# Patient Record
Sex: Female | Born: 1990 | Hispanic: No | Marital: Single
Health system: Northeastern US, Academic
[De-identification: ages and names within clinical notes are randomized; demographics above are authoritative.]

## PROBLEM LIST (undated history)

## (undated) DIAGNOSIS — R102 Pelvic and perineal pain unspecified side: Secondary | ICD-10-CM

## (undated) DIAGNOSIS — N739 Female pelvic inflammatory disease, unspecified: Secondary | ICD-10-CM

---

## 2012-05-08 ENCOUNTER — Encounter (HOSPITAL_COMMUNITY): Payer: Self-pay | Admitting: Emergency Medicine

## 2012-05-08 ENCOUNTER — Emergency Department (HOSPITAL_COMMUNITY): Payer: BC Managed Care – PPO

## 2012-05-08 ENCOUNTER — Emergency Department (HOSPITAL_COMMUNITY)
Admission: EM | Admit: 2012-05-08 | Discharge: 2012-05-08 | Disposition: A | Discharge status: Home / Self Care | Payer: BC Managed Care – PPO | Attending: Emergency Medicine | Admitting: Emergency Medicine

## 2012-05-08 DIAGNOSIS — M940 Chondrocostal junction syndrome [Tietze]: Secondary | ICD-10-CM

## 2012-05-08 DIAGNOSIS — B9689 Other specified bacterial agents as the cause of diseases classified elsewhere: Secondary | ICD-10-CM | POA: Insufficient documentation

## 2012-05-08 DIAGNOSIS — N760 Acute vaginitis: Secondary | ICD-10-CM

## 2012-05-08 DIAGNOSIS — N390 Urinary tract infection, site not specified: Secondary | ICD-10-CM

## 2012-05-08 DIAGNOSIS — R0602 Shortness of breath: Secondary | ICD-10-CM | POA: Insufficient documentation

## 2012-05-08 DIAGNOSIS — J029 Acute pharyngitis, unspecified: Secondary | ICD-10-CM

## 2012-05-08 DIAGNOSIS — A499 Bacterial infection, unspecified: Secondary | ICD-10-CM | POA: Insufficient documentation

## 2012-05-08 LAB — URINALYSIS, ROUTINE W REFLEX MICROSCOPIC
Glucose, UA: NEGATIVE mg/dL
Ketones, ur: 40 mg/dL — AB
Nitrite: NEGATIVE
Nitrite: NEGATIVE
Specific Gravity, Urine: 1.031 — ABNORMAL HIGH (ref 1.005–1.030)
Specific Gravity, Urine: 1.031 — ABNORMAL HIGH (ref 1.005–1.030)
Urobilinogen, UA: 1 mg/dL (ref 0.0–1.0)
pH: 5.5 (ref 5.0–8.0)
pH: 6 (ref 5.0–8.0)

## 2012-05-08 LAB — URINE MICROSCOPIC-ADD ON

## 2012-05-08 LAB — WET PREP, GENITAL
Trich, Wet Prep: NONE SEEN
Yeast Wet Prep HPF POC: NONE SEEN

## 2012-05-08 LAB — CBC WITH DIFFERENTIAL/PLATELET
Basophils Relative: 0 % (ref 0–1)
Eosinophils Absolute: 0 10*3/uL (ref 0.0–0.7)
Lymphs Abs: 1.2 10*3/uL (ref 0.7–4.0)
MCH: 29 pg (ref 26.0–34.0)
MCHC: 33.3 g/dL (ref 30.0–36.0)
Neutro Abs: 10.8 10*3/uL — ABNORMAL HIGH (ref 1.7–7.7)
Neutrophils Relative %: 85 % — ABNORMAL HIGH (ref 43–77)
Platelets: 278 10*3/uL (ref 150–400)
RBC: 4.17 MIL/uL (ref 3.87–5.11)

## 2012-05-08 LAB — POCT I-STAT TROPONIN I: Troponin i, poc: 0 ng/mL (ref 0.00–0.08)

## 2012-05-08 LAB — BASIC METABOLIC PANEL
BUN: 8 mg/dL (ref 6–23)
CO2: 24 mEq/L (ref 19–32)
Calcium: 9.8 mg/dL (ref 8.4–10.5)
Chloride: 103 mEq/L (ref 96–112)
Creatinine, Ser: 0.92 mg/dL (ref 0.50–1.10)
Glucose, Bld: 77 mg/dL (ref 70–99)

## 2012-05-08 LAB — CBC
HCT: 36.2 % (ref 36.0–46.0)
MCH: 29.6 pg (ref 26.0–34.0)
MCV: 87.2 fL (ref 78.0–100.0)
Platelets: 286 10*3/uL (ref 150–400)
RBC: 4.15 MIL/uL (ref 3.87–5.11)
WBC: 11.3 10*3/uL — ABNORMAL HIGH (ref 4.0–10.5)

## 2012-05-08 LAB — D-DIMER, QUANTITATIVE: D-Dimer, Quant: <0.27 ug/mL-FEU (ref 0.00–0.48)

## 2012-05-08 MED ORDER — AZITHROMYCIN 250 MG PO TABS
1000.0000 mg | ORAL_TABLET | Freq: Once | ORAL | Status: AC
  Administered 2012-05-08: 1000 mg via ORAL
  Filled 2012-05-08: qty 4

## 2012-05-08 MED ORDER — SULFAMETHOXAZOLE-TRIMETHOPRIM 800-160 MG PO TABS: 1.0000 | ORAL_TABLET | Freq: Two times a day (BID) | ORAL | Status: DC

## 2012-05-08 MED ORDER — CEFTRIAXONE SODIUM 250 MG IJ SOLR
250.0000 mg | Freq: Once | INTRAMUSCULAR | Status: AC
  Administered 2012-05-08: 250 mg via INTRAMUSCULAR
  Filled 2012-05-08: qty 250

## 2012-05-08 MED ORDER — FAMOTIDINE 20 MG PO TABS: 20.0000 mg | ORAL_TABLET | Freq: Two times a day (BID) | ORAL | Status: DC

## 2012-05-08 MED ORDER — HYDROCODONE-ACETAMINOPHEN 5-500 MG PO TABS: 1.0000 | ORAL_TABLET | Freq: Four times a day (QID) | ORAL | Status: DC | PRN

## 2012-05-08 MED ORDER — METRONIDAZOLE 500 MG PO TABS: 500.0000 mg | ORAL_TABLET | Freq: Two times a day (BID) | ORAL | Status: DC

## 2012-05-08 MED ORDER — LIDOCAINE HCL (PF) 1 % IJ SOLN
INTRAMUSCULAR | Status: AC
  Administered 2012-05-08: 5 mL
  Filled 2012-05-08: qty 5

## 2012-05-08 NOTE — ED Notes (Signed)
Pt comes to the ED c/o sore throat, centralized chest pain, and LLQ abdominal pain. Chest pain has been ongoing to for the past two weeks and hurts to breathe or talk  - abdominal pain has been for the past five days.  Pt reports, "It feels a little uncomfortable." when asked about pain when urinating.   Rates pain 5/10

## 2012-05-08 NOTE — ED Notes (Signed)
Pelvic cart set up at bedside  

## 2012-05-08 NOTE — ED Notes (Signed)
In to administer meds; pt currently in [place]

## 2012-05-08 NOTE — ED Notes (Addendum)
Advised pt of longest wait time for department.  Advised that if there is any change in condition or needs to let this RN know.

## 2012-05-08 NOTE — ED Notes (Signed)
Pt c/o intermittent mid sternal CP x 2 weeks worse when laying flat; pt sts sore throat x several days

## 2012-05-08 NOTE — ED Notes (Signed)
I-stat troponin 0.00 

## 2012-05-08 NOTE — ED Provider Notes (Signed)
History     CSN: [id number]  Arrival date & time 05/08/12  1239   First MD Initiated Contact with Patient 05/08/12 1507      Chief Complaint  Patient presents with  . Chest Pain  . Sore Throat  . Abdominal Pain    (Consider location/radiation/quality/duration/timing/severity/associated sxs/prior treatment) The history is provided by the patient and medical records.    Kristine Ho is a 21 y.o. female presents to the emergency department c/o 2 weeks of chest pain.  Pain was gradual onset, described as achy, increased with movement, talking and lying flat, located around the sternum, rated at a 5/10, non-radiating.  Pain waxes and wanes, but has not resolved.  Denies nausea, vomiting, diarrhea, lightheaded, dizzy, syncopal episodes.  She has associated shortness of breath.  Pt endorses heart murmur as a child, no other cardiac Hx.  Denies family Hx of MI, CAD or sudden cardiac death.    She also c/o sore throat and pelvic pain which both began about 5 days ago.  Pain in the throat was of gradual onset and is located in the R > L. Denies fever, congestion, rhinorrhea, sinus pain, sick contacts.  Pelvic pain is located in the LLQ, gradual onset, constant, described as achy.  Denies constipated, dysuria, hematuria, frequency, urgency, vaginal bleeding, vaginal discharge.  Menstrual cycle has been regular.  LMP 2 weeks ago.  Pt is sexually active with 1 partner with inconsistent use of protection.  Pt takes OCP.  Denies surgery, long trips, hemoptysis, immobilization.    History reviewed. No pertinent past medical history.  History reviewed. No pertinent past surgical history.  History reviewed. No pertinent family history.  History  Substance Use Topics  . Smoking status: Never Smoker   . Smokeless tobacco: Not on file  . Alcohol Use: No    OB History    Grav Para Term Preterm Abortions TAB SAB Ect Mult Living                  Review of Systems  Constitutional: Negative for  fever, chills, diaphoresis, appetite change, fatigue and unexpected weight change.  HENT: Positive for sore throat. Negative for congestion, rhinorrhea, mouth sores, trouble swallowing, neck pain, neck stiffness and sinus pressure.   Eyes: Negative for visual disturbance.  Respiratory: Positive for shortness of breath. Negative for cough, chest tightness and wheezing.   Cardiovascular: Positive for chest pain.  Gastrointestinal: Positive for abdominal pain. Negative for nausea, vomiting, diarrhea and constipation.  Genitourinary: Positive for pelvic pain. Negative for dysuria, urgency, frequency and hematuria.  Musculoskeletal: Negative for back pain and joint swelling.  Skin: Negative for rash.  Neurological: Negative for syncope, light-headedness and headaches.  Psychiatric/Behavioral: Negative for disturbed wake/sleep cycle. The patient is not nervous/anxious.     Allergies  Review of patient's allergies indicates no known allergies.  Home Medications  No current outpatient prescriptions on file.  BP 145/96  Pulse 63  Temp 98.7 F (37.1 C) (Oral)  Resp 18  SpO2 95%  LMP 04/24/2012  Physical Exam  Nursing note and vitals reviewed. Constitutional: She appears well-developed and well-nourished. No distress.  HENT:  Head: Normocephalic and atraumatic.  Right Ear: Tympanic membrane, external ear and ear canal normal.  Left Ear: Tympanic membrane, external ear and ear canal normal.  Nose: Nose normal. Right sinus exhibits no maxillary sinus tenderness and no frontal sinus tenderness. Left sinus exhibits no maxillary sinus tenderness and no frontal sinus tenderness.  Mouth/Throat: Uvula is midline and mucous  membranes are normal. No oropharyngeal exudate, posterior oropharyngeal edema, posterior oropharyngeal erythema or tonsillar abscesses.  Eyes: Conjunctivae normal are normal. Pupils are equal, round, and reactive to light. No scleral icterus.  Neck: Normal range of motion. Neck  supple. No JVD present. No tracheal deviation present.  Cardiovascular: Normal rate, regular rhythm, normal heart sounds and intact distal pulses.  Exam reveals no gallop and no friction rub.   No murmur heard. Pulmonary/Chest: Effort normal and breath sounds normal. No respiratory distress. She has no wheezes. She exhibits tenderness (TTP over the sternal borders).  Abdominal: Soft. Normal appearance and bowel sounds are normal. She exhibits no mass. There is tenderness in the left lower quadrant. There is no rigidity, no rebound, no guarding, no CVA tenderness, no tenderness at McBurney's point and negative Murphy's sign.  Genitourinary:       Pelvic exam:  VULVA: normal appearing vulva with no masses, tenderness or lesions,  VAGINA: normal appearing vagina with normal color, no lesions, vaginal discharge - white, creamy and malodorous,  CERVIX: normal appearing cervix without lesions, no cervical motion tenderness cervical discharge present - white, creamy and malodorous,  UTERUS: uterus is normal size, shape, consistency and nontender,  ADNEXA: normal adnexa in size, no masses, tenderness left, no tenderness on right WET MOUNT done - results: clue cells, white blood cells, DNA probe for chlamydia and GC obtained, exam chaperoned by Carollee Herter, RN   Musculoskeletal: Normal range of motion. She exhibits no edema and no tenderness.  Lymphadenopathy:    She has no cervical adenopathy.  Neurological: She is alert. She exhibits normal muscle tone. Coordination normal.       Speech is clear and goal oriented Moves extremities without ataxia  Skin: Skin is warm and dry. No rash noted. She is not diaphoretic.  Psychiatric: She has a normal mood and affect.    ED Course  Procedures (including critical care time)  Labs Reviewed  CBC - Abnormal; Notable for the following:    WBC 11.3 (*)     All other components within normal limits  URINALYSIS, ROUTINE W REFLEX MICROSCOPIC - Abnormal; Notable  for the following:    Color, Urine AMBER (*)  BIOCHEMICALS MAY BE AFFECTED BY COLOR   Specific Gravity, Urine 1.031 (*)     Hgb urine dipstick SMALL (*)     Bilirubin Urine SMALL (*)     Ketones, ur 40 (*)     Leukocytes, UA SMALL (*)     All other components within normal limits  URINALYSIS, ROUTINE W REFLEX MICROSCOPIC - Abnormal; Notable for the following:    Color, Urine AMBER (*)  BIOCHEMICALS MAY BE AFFECTED BY COLOR   APPearance CLOUDY (*)     Specific Gravity, Urine 1.031 (*)     Hgb urine dipstick SMALL (*)     Bilirubin Urine SMALL (*)     Ketones, ur 40 (*)     Leukocytes, UA LARGE (*)     All other components within normal limits  CBC WITH DIFFERENTIAL - Abnormal; Notable for the following:    WBC 12.7 (*)     Neutrophils Relative 85 (*)     Neutro Abs 10.8 (*)     Lymphocytes Relative 9 (*)     All other components within normal limits  WET PREP, GENITAL - Abnormal; Notable for the following:    Clue Cells Wet Prep HPF POC MODERATE (*)     WBC, Wet Prep HPF POC MANY (*)  All other components within normal limits  URINE MICROSCOPIC-ADD ON - Abnormal; Notable for the following:    Squamous Epithelial / LPF MANY (*)     Bacteria, UA MANY (*)     Casts HYALINE CASTS (*)     All other components within normal limits  URINE MICROSCOPIC-ADD ON - Abnormal; Notable for the following:    Squamous Epithelial / LPF FEW (*)     Bacteria, UA FEW (*)     All other components within normal limits  BASIC METABOLIC PANEL  RAPID STREP SCREEN  POCT PREGNANCY, URINE  POCT I-STAT TROPONIN I  D-DIMER, QUANTITATIVE  GC/CHLAMYDIA PROBE AMP, GENITAL  URINALYSIS, ROUTINE W REFLEX MICROSCOPIC   Dg Chest 2 View  05/08/2012  *RADIOLOGY REPORT*  Clinical Data: Chest pain, sore throat.  CHEST - 2 VIEW  Comparison: None.  Findings: Heart and mediastinal contours are within normal limits. No focal opacities or effusions.  No acute bony abnormality.  IMPRESSION: No active cardiopulmonary  disease.   Original Report Authenticated By: Cyndie Chime, M.D.     Results for orders placed during the hospital encounter of 05/08/12  CBC      Component Value Range   WBC 11.3 (*) 4.0 - 10.5 K/uL   RBC 4.15  3.87 - 5.11 MIL/uL   Hemoglobin 12.3  12.0 - 15.0 g/dL   HCT 16.1  09.6 - 04.5 %   MCV 87.2  78.0 - 100.0 fL   MCH 29.6  26.0 - 34.0 pg   MCHC 34.0  30.0 - 36.0 g/dL   RDW 40.9  81.1 - 91.4 %   Platelets 286  150 - 400 K/uL  BASIC METABOLIC PANEL      Component Value Range   Sodium 139  135 - 145 mEq/L   Potassium 4.1  3.5 - 5.1 mEq/L   Chloride 103  96 - 112 mEq/L   CO2 24  19 - 32 mEq/L   Glucose, Bld 77  70 - 99 mg/dL   BUN 8  6 - 23 mg/dL   Creatinine, Ser 7.82  0.50 - 1.10 mg/dL   Calcium 9.8  8.4 - 95.6 mg/dL   GFR calc non Af Amer >90  >90 mL/min   GFR calc Af Amer >90  >90 mL/min  RAPID STREP SCREEN      Component Value Range   Streptococcus, Group A Screen (Direct) NEGATIVE  NEGATIVE  URINALYSIS, ROUTINE W REFLEX MICROSCOPIC      Component Value Range   Color, Urine AMBER (*) YELLOW   APPearance CLEAR  CLEAR   Specific Gravity, Urine 1.031 (*) 1.005 - 1.030   pH 6.0  5.0 - 8.0   Glucose, UA NEGATIVE  NEGATIVE mg/dL   Hgb urine dipstick SMALL (*) NEGATIVE   Bilirubin Urine SMALL (*) NEGATIVE   Ketones, ur 40 (*) NEGATIVE mg/dL   Protein, ur NEGATIVE  NEGATIVE mg/dL   Urobilinogen, UA 1.0  0.0 - 1.0 mg/dL   Nitrite NEGATIVE  NEGATIVE   Leukocytes, UA SMALL (*) NEGATIVE  POCT PREGNANCY, URINE      Component Value Range   Preg Test, Ur NEGATIVE  NEGATIVE  POCT I-STAT TROPONIN I      Component Value Range   Troponin i, poc 0.00  0.00 - 0.08 ng/mL   Comment 3           URINALYSIS, ROUTINE W REFLEX MICROSCOPIC      Component Value Range   Color, Urine AMBER (*)  YELLOW   APPearance CLOUDY (*) CLEAR   Specific Gravity, Urine 1.031 (*) 1.005 - 1.030   pH 5.5  5.0 - 8.0   Glucose, UA NEGATIVE  NEGATIVE mg/dL   Hgb urine dipstick SMALL (*) NEGATIVE    Bilirubin Urine SMALL (*) NEGATIVE   Ketones, ur 40 (*) NEGATIVE mg/dL   Protein, ur NEGATIVE  NEGATIVE mg/dL   Urobilinogen, UA 1.0  0.0 - 1.0 mg/dL   Nitrite NEGATIVE  NEGATIVE   Leukocytes, UA LARGE (*) NEGATIVE  CBC WITH DIFFERENTIAL      Component Value Range   WBC 12.7 (*) 4.0 - 10.5 K/uL   RBC 4.17  3.87 - 5.11 MIL/uL   Hemoglobin 12.1  12.0 - 15.0 g/dL   HCT 82.9  56.2 - 13.0 %   MCV 87.1  78.0 - 100.0 fL   MCH 29.0  26.0 - 34.0 pg   MCHC 33.3  30.0 - 36.0 g/dL   RDW 86.5  78.4 - 69.6 %   Platelets 278  150 - 400 K/uL   Neutrophils Relative 85 (*) 43 - 77 %   Neutro Abs 10.8 (*) 1.7 - 7.7 K/uL   Lymphocytes Relative 9 (*) 12 - 46 %   Lymphs Abs 1.2  0.7 - 4.0 K/uL   Monocytes Relative 6  3 - 12 %   Monocytes Absolute 0.7  0.1 - 1.0 K/uL   Eosinophils Relative 0  0 - 5 %   Eosinophils Absolute 0.0  0.0 - 0.7 K/uL   Basophils Relative 0  0 - 1 %   Basophils Absolute 0.0  0.0 - 0.1 K/uL  D-DIMER, QUANTITATIVE      Component Value Range   D-Dimer, Quant <0.27  0.00 - 0.48 ug/mL-FEU  WET PREP, GENITAL      Component Value Range   Yeast Wet Prep HPF POC NONE SEEN  NONE SEEN   Trich, Wet Prep NONE SEEN  NONE SEEN   Clue Cells Wet Prep HPF POC MODERATE (*) NONE SEEN   WBC, Wet Prep HPF POC MANY (*) NONE SEEN  URINE MICROSCOPIC-ADD ON      Component Value Range   Squamous Epithelial / LPF MANY (*) RARE   WBC, UA 21-50  <3 WBC/hpf   RBC / HPF 3-6  <3 RBC/hpf   Bacteria, UA MANY (*) RARE   Casts HYALINE CASTS (*) NEGATIVE   Urine-Other MUCOUS PRESENT    URINE MICROSCOPIC-ADD ON      Component Value Range   Squamous Epithelial / LPF FEW (*) RARE   WBC, UA 7-10  <3 WBC/hpf   RBC / HPF 3-6  <3 RBC/hpf   Bacteria, UA FEW (*) RARE   Urine-Other MUCOUS PRESENT     Dg Chest 2 View  05/08/2012  *RADIOLOGY REPORT*  Clinical Data: Chest pain, sore throat.  CHEST - 2 VIEW  Comparison: None.  Findings: Heart and mediastinal contours are within normal limits. No focal opacities  or effusions.  No acute bony abnormality.  IMPRESSION: No active cardiopulmonary disease.   Original Report Authenticated By: Cyndie Chime, M.D.     ECG:  Date: 05/08/2012  Rate: 75  Rhythm: sinus arrhythmia  QRS Axis: normal  Intervals: normal  ST/T Wave abnormalities: normal  Conduction Disutrbances:none  Narrative Interpretation: NSR  Old EKG Reviewed: none available   1. BV (bacterial vaginosis)   2. Costochondritis   3. Bacterial vaginal infection   4. UTI (lower urinary tract infection)  MDM  Kristine Ho presents with chest pain, shortness of breath, pelvic pain.  DDX of MI, PE, costochondritis.  Wells Criteria for PE is low risk, pt not PERC negative 2/2 exogenous estrogen use.   Chest pain is not likely of cardiac or pulmonary etiology d/t presentation, VSS, no tracheal deviation, no JVD or new murmur, RRR, breath sounds equal bilaterally, EKG without acute abnormalities, negative troponin, and negative CXR.  CBC with mild leukocytosis of 12.7, with a left shift, rapid strep screen negative, BMP unremarkable, urine pregnancy negative, troponin negative, d-dimer negative.  Rapid strep negative.  Pelvic with left adnexal tenderness, no mass.  Ultrasound ordered to rule out ovarian torsion.  PID unlikely secondary to patient afebrile, no cervical motion tenderness.  Wet prep with moderate clue cells and many white blood cells. Patient treated prophylactically for GC and chlamydia. Will plan to DC home with treatment for UTI and BV.  Patient with Marlon Pel, PA-C in the CDU patient will await disposition in that while unit waiting for her ultrasound.  Case has been discussed with Dr. Ranae Palms who agrees with the above plan to discharge.          Dierdre Forth, PA-C 05/08/12 1840

## 2012-05-08 NOTE — ED Notes (Signed)
Assisted PA with pelvic exam - cultures obtained and sent to lab; pt tolerated procedure well

## 2012-05-10 ENCOUNTER — Encounter (HOSPITAL_COMMUNITY): Payer: Self-pay | Admitting: *Deleted

## 2012-05-10 ENCOUNTER — Telehealth (HOSPITAL_COMMUNITY): Payer: Self-pay | Admitting: Emergency Medicine

## 2012-05-10 ENCOUNTER — Emergency Department (HOSPITAL_COMMUNITY)
Admission: EM | Admit: 2012-05-10 | Discharge: 2012-05-10 | Disposition: A | Discharge status: Home / Self Care | Payer: BC Managed Care – PPO | Attending: Emergency Medicine | Admitting: Emergency Medicine

## 2012-05-10 DIAGNOSIS — T361X5A Adverse effect of cephalosporins and other beta-lactam antibiotics, initial encounter: Secondary | ICD-10-CM | POA: Insufficient documentation

## 2012-05-10 DIAGNOSIS — T3795XA Adverse effect of unspecified systemic anti-infective and antiparasitic, initial encounter: Secondary | ICD-10-CM | POA: Insufficient documentation

## 2012-05-10 DIAGNOSIS — T50905A Adverse effect of unspecified drugs, medicaments and biological substances, initial encounter: Secondary | ICD-10-CM

## 2012-05-10 DIAGNOSIS — T373X5A Adverse effect of other antiprotozoal drugs, initial encounter: Secondary | ICD-10-CM | POA: Insufficient documentation

## 2012-05-10 DIAGNOSIS — T50995A Adverse effect of other drugs, medicaments and biological substances, initial encounter: Secondary | ICD-10-CM | POA: Insufficient documentation

## 2012-05-10 MED ORDER — NITROFURANTOIN MACROCRYSTAL 100 MG PO CAPS: 100.0000 mg | ORAL_CAPSULE | Freq: Two times a day (BID) | ORAL | Status: DC

## 2012-05-10 MED ORDER — ONDANSETRON 4 MG PO TBDP
ORAL_TABLET | ORAL | Status: AC
  Filled 2012-05-10: qty 1

## 2012-05-10 MED ORDER — METRONIDAZOLE 0.75 % VA GEL: 1.0000 | Freq: Two times a day (BID) | VAGINAL | Status: DC

## 2012-05-10 NOTE — ED Provider Notes (Signed)
History     CSN: [id number]  Arrival date & time 05/10/12  4098   First MD Initiated Contact with Patient 05/10/12 2223      Chief Complaint  Patient presents with  . Nausea    (Consider location/radiation/quality/duration/timing/severity/associated sxs/prior treatment) HPI Comments: Seen and treated this ed several days ago for BV and UTI since has been vomiting the medications she take all the meds at the same time   The history is provided by the patient.    History reviewed. No pertinent past medical history.  History reviewed. No pertinent past surgical history.  No family history on file.  History  Substance Use Topics  . Smoking status: Never Smoker   . Smokeless tobacco: Not on file  . Alcohol Use: No    OB History    Grav Para Term Preterm Abortions TAB SAB Ect Mult Living                  Review of Systems  Constitutional: Negative for fever.  Gastrointestinal: Positive for nausea and vomiting. Negative for abdominal pain.  Genitourinary: Negative for dysuria, vaginal discharge and vaginal pain.  Skin: Negative for rash and wound.  Neurological: Negative for dizziness.    Allergies  Review of patient's allergies indicates no known allergies.  Home Medications   Current Outpatient Rx  Name Route Sig Dispense Refill  . FAMOTIDINE 20 MG PO TABS Oral Take 20 mg by mouth 2 (two) times daily.    Marland Kitchen HYDROCODONE-ACETAMINOPHEN 5-500 MG PO TABS Oral Take 1-2 tablets by mouth every 6 (six) hours as needed.    Azzie Roup ACE-ETH ESTRAD-FE 1-20 MG-MCG PO TABS Oral Take 1 tablet by mouth daily.    Marland Kitchen METRONIDAZOLE 0.75 % VA GEL Vaginal Place 1 Applicatorful vaginally 2 (two) times daily. 70 g 0  . NITROFURANTOIN MACROCRYSTAL 100 MG PO CAPS Oral Take 1 capsule (100 mg total) by mouth 2 (two) times daily. 14 capsule 0    BP 112/55  Pulse 66  Temp 98.5 F (36.9 C) (Oral)  Resp 16  SpO2 100%  LMP 04/24/2012  Physical Exam  Constitutional: She is oriented to  person, place, and time. She appears well-developed and well-nourished.  HENT:  Head: Normocephalic.  Eyes: Pupils are equal, round, and reactive to light.  Neck: Normal range of motion.  Cardiovascular: Normal rate.   Abdominal: Soft. She exhibits no distension. There is no tenderness.  Neurological: She is alert and oriented to person, place, and time.  Skin: Skin is warm. No rash noted.    ED Course  Procedures (including critical care time)  Labs Reviewed - No data to display No results found.   1. Medication adverse effect       MDM  Unclear which medication is causing nausea but will change both Septra to Keflex and Flagy to metrogel intravaginal cream         Arman Filter, NP 05/11/12 [phone]

## 2012-05-10 NOTE — ED Notes (Signed)
Pt actively vomiting in waiting room; zofran ODT given per protocol.

## 2012-05-10 NOTE — ED Notes (Signed)
+  Chlamydia. Patient treated with Rocephin and Zithromax. [facility] faxed. 

## 2012-05-10 NOTE — ED Provider Notes (Signed)
Medical screening examination/treatment/procedure(s) were performed by non-physician practitioner and as supervising physician I was immediately available for consultation/collaboration.   Evgenia Merriman, MD 05/10/12 0649 

## 2012-05-10 NOTE — ED Notes (Signed)
The pt has been nauseated since Thursday when she was seen here. She thinks the med she was given  Is causing her nausea.  She was treated for a std.  lmp 2 weeks ago

## 2012-05-11 NOTE — ED Provider Notes (Signed)
Medical screening examination/treatment/procedure(s) were performed by non-physician practitioner and as supervising physician I was immediately available for consultation/collaboration  Charles B. Sheldon, MD 05/11/12 1507 

## 2013-03-25 ENCOUNTER — Other Ambulatory Visit (HOSPITAL_COMMUNITY)
Admission: RE | Admit: 2013-03-25 | Discharge: 2013-03-25 | Disposition: A | Discharge status: Home / Self Care | Payer: BC Managed Care – PPO | Source: Ambulatory Visit | Attending: Emergency Medicine | Admitting: Emergency Medicine

## 2013-03-25 ENCOUNTER — Encounter (HOSPITAL_COMMUNITY): Payer: Self-pay | Admitting: Emergency Medicine

## 2013-03-25 ENCOUNTER — Emergency Department (HOSPITAL_COMMUNITY)
Admission: EM | Admit: 2013-03-25 | Discharge: 2013-03-25 | Disposition: A | Discharge status: Home / Self Care | Payer: BC Managed Care – PPO | Source: Home / Self Care

## 2013-03-25 DIAGNOSIS — N730 Acute parametritis and pelvic cellulitis: Secondary | ICD-10-CM

## 2013-03-25 DIAGNOSIS — Z113 Encounter for screening for infections with a predominantly sexual mode of transmission: Secondary | ICD-10-CM | POA: Insufficient documentation

## 2013-03-25 DIAGNOSIS — N760 Acute vaginitis: Secondary | ICD-10-CM | POA: Insufficient documentation

## 2013-03-25 DIAGNOSIS — N72 Inflammatory disease of cervix uteri: Secondary | ICD-10-CM

## 2013-03-25 LAB — POCT URINALYSIS DIP (DEVICE)
Ketones, ur: NEGATIVE mg/dL
Protein, ur: NEGATIVE mg/dL
Specific Gravity, Urine: >=1.03 (ref 1.005–1.030)

## 2013-03-25 LAB — POCT PREGNANCY, URINE: Preg Test, Ur: NEGATIVE

## 2013-03-25 MED ORDER — AZITHROMYCIN 250 MG PO TABS: ORAL_TABLET | ORAL | Status: DC

## 2013-03-25 MED ORDER — METRONIDAZOLE 500 MG PO TABS: 500.0000 mg | ORAL_TABLET | Freq: Two times a day (BID) | ORAL | Status: DC

## 2013-03-25 MED ORDER — CEFTRIAXONE SODIUM 250 MG IJ SOLR
INTRAMUSCULAR | Status: AC
  Filled 2013-03-25: qty 250

## 2013-03-25 MED ORDER — LIDOCAINE HCL (PF) 1 % IJ SOLN
INTRAMUSCULAR | Status: AC
  Filled 2013-03-25: qty 5

## 2013-03-25 MED ORDER — CEFTRIAXONE SODIUM 250 MG IJ SOLR
250.0000 mg | Freq: Once | INTRAMUSCULAR | Status: AC
  Administered 2013-03-25: 250 mg via INTRAMUSCULAR

## 2013-03-25 NOTE — ED Notes (Signed)
Pt c/o poss UTI onset 1 week sxs include: lower abd pain, mild back pain, urinary freq/urgency Denies: dysuria, hematuria, fevers, vag d/c Has increased her water intake... Alert w/no signs of acute distress.

## 2013-03-25 NOTE — ED Provider Notes (Signed)
CSN: [id number]     Arrival date & time 03/25/13  1312 History     First MD Initiated Contact with Patient 03/25/13 1359     Chief Complaint  Patient presents with  . Urinary Tract Infection   (Consider location/radiation/quality/duration/timing/severity/associated sxs/prior Treatment) HPI Comments: 22 year old female presents with mild central pelvic discomfort post voiding. She denies dysuria but states she has some increase in urinary frequency. Symptoms began approximately 2 days ago. She denies no vaginal discharge. No fever abdominal pain, back pain, chest pain, cough or shortness of breath. States her LMP was 1 week ago.  Patient is a 22 y.o. female presenting with urinary tract infection.  Urinary Tract Infection    History reviewed. No pertinent past medical history. History reviewed. No pertinent past surgical history. No family history on file. History  Substance Use Topics  . Smoking status: Never Smoker   . Smokeless tobacco: Not on file  . Alcohol Use: No   OB History   Grav Para Term Preterm Abortions TAB SAB Ect Mult Living                 Review of Systems  Constitutional: Negative.   Respiratory: Negative.   Cardiovascular: Negative.   Gastrointestinal: Negative.   Genitourinary: Positive for frequency and pelvic pain. Negative for dysuria, hematuria, flank pain, vaginal bleeding, vaginal discharge, difficulty urinating, vaginal pain and menstrual problem.  Skin: Negative.   Neurological: Negative for dizziness and weakness.    Allergies  Review of patient's allergies indicates no known allergies.  Home Medications   Current Outpatient Rx  Name  Route  Sig  Dispense  Refill  . norethindrone-ethinyl estradiol (JUNEL FE,GILDESS FE,LOESTRIN FE) 1-20 MG-MCG tablet   Oral   Take 1 tablet by mouth daily.         Marland Kitchen azithromycin (ZITHROMAX) 250 MG tablet      Take all 4 tabs po stat   4 each   0   . famotidine (PEPCID) 20 MG tablet   Oral  Take 20 mg by mouth 2 (two) times daily.         Marland Kitchen HYDROcodone-acetaminophen (VICODIN) 5-500 MG per tablet   Oral   Take 1-2 tablets by mouth every 6 (six) hours as needed.         . metroNIDAZOLE (FLAGYL) 500 MG tablet   Oral   Take 1 tablet (500 mg total) by mouth 2 (two) times daily. X 7 days   14 tablet   0   . metroNIDAZOLE (METROGEL VAGINAL) 0.75 % vaginal gel   Vaginal   Place 1 Applicatorful vaginally 2 (two) times daily.   70 g   0   . nitrofurantoin (MACRODANTIN) 100 MG capsule   Oral   Take 1 capsule (100 mg total) by mouth 2 (two) times daily.   14 capsule   0    BP 104/60  Pulse 68  Temp(Src) 99.9 F (37.7 C) (Oral)  Resp 18  SpO2 98%  LMP 03/18/2013 Physical Exam  Nursing note and vitals reviewed. Constitutional: She is oriented to person, place, and time. She appears well-developed and well-nourished. No distress.  Neck: Normal range of motion. Neck supple.  Cardiovascular: Normal rate, regular rhythm and normal heart sounds.   Pulmonary/Chest: Effort normal and breath sounds normal. No respiratory distress. She has no wheezes.  Abdominal: Soft. She exhibits no mass. There is no tenderness. There is no rebound and no guarding.  Genitourinary:  External pelvic palpation reveals mild tenderness  over the suprapubic area. No tenderness over the external adnexa or abdomen. Normal external female genitalia. There is a median thick gray discharge in the introitus. This discharged increases in volume coating the vaginal walls in the vaginal vault. The cervix is midline and is also treated with a thick gray discharge. The os is not parous but the os and ectocervix is erythematous. Bimanual reveals positive CMT and bilateral adnexal tenderness.   Musculoskeletal: She exhibits no edema and no tenderness.  Neurological: She is alert and oriented to person, place, and time. She exhibits normal muscle tone.  Skin: Skin is warm and dry.  Psychiatric: She has a  normal mood and affect.    ED Course   Procedures (including critical care time)  Labs Reviewed  POCT URINALYSIS DIP (DEVICE) - Abnormal; Notable for the following:    Hgb urine dipstick TRACE (*)    All other components within normal limits  POCT PREGNANCY, URINE  CERVICOVAGINAL ANCILLARY ONLY   Results for orders placed during the hospital encounter of 03/25/13  POCT URINALYSIS DIP (DEVICE)      Result Value Range   Glucose, UA NEGATIVE  NEGATIVE mg/dL   Bilirubin Urine NEGATIVE  NEGATIVE   Ketones, ur NEGATIVE  NEGATIVE mg/dL   Specific Gravity, Urine >=1.030  1.005 - 1.030   Hgb urine dipstick TRACE (*) NEGATIVE   pH 6.0  5.0 - 8.0   Protein, ur NEGATIVE  NEGATIVE mg/dL   Urobilinogen, UA 0.2  0.0 - 1.0 mg/dL   Nitrite NEGATIVE  NEGATIVE   Leukocytes, UA NEGATIVE  NEGATIVE  POCT PREGNANCY, URINE      Result Value Range   Preg Test, Ur NEGATIVE  NEGATIVE    No results found. 1. PID (acute pelvic inflammatory disease)   2. Vaginitis   3. Cervicitis     MDM  Rocephin 250 Milligrams IM now Rx: Azithromycin 250 mg 4 tablets by mouth stat Rx: Flagyl 500 mg twice a day for 7 days Instructions for vaginitis, PID. No alcohol for 10 days Swabs obtained for ancillary testing.   Hayden Rasmussen, NP 03/25/13 [id number]

## 2013-03-25 NOTE — ED Provider Notes (Signed)
Medical screening examination/treatment/procedure(s) were performed by non-physician practitioner and as supervising physician I was immediately available for consultation/collaboration.  Raynald Blend, MD 03/25/13 1510

## 2013-03-28 ENCOUNTER — Telehealth (HOSPITAL_COMMUNITY): Payer: Self-pay | Admitting: *Deleted

## 2013-03-28 NOTE — ED Notes (Signed)
GC/Chlamydia neg., Affirm: all neg.  Message from Kristine Rasmussen NP to call pt. To not fill Rx. Of Zithromax, since all she had was bacterial vaginosis.  I called pt. Pt. verified x 2 and given results.  She said she has already taken the Zithromax.  She said she still has pelvic pain but it is not as bad as it was.  I told her to come back if not better after the Flagy or getting worse.  Pt. voiced understanding. Kristine Ho 03/28/2013

## 2013-04-23 ENCOUNTER — Encounter (HOSPITAL_COMMUNITY): Payer: Self-pay | Admitting: Emergency Medicine

## 2013-04-23 ENCOUNTER — Inpatient Hospital Stay (HOSPITAL_COMMUNITY)
Admission: AD | Admit: 2013-04-23 | Discharge: 2013-04-23 | Disposition: A | Discharge status: Home / Self Care | Payer: BC Managed Care – PPO | Source: Ambulatory Visit | Attending: Obstetrics & Gynecology | Admitting: Obstetrics & Gynecology

## 2013-04-23 ENCOUNTER — Emergency Department (HOSPITAL_COMMUNITY)
Admission: EM | Admit: 2013-04-23 | Discharge: 2013-04-23 | Disposition: A | Discharge status: Home / Self Care | Payer: BC Managed Care – PPO | Source: Home / Self Care | Attending: Family Medicine | Admitting: Family Medicine

## 2013-04-23 ENCOUNTER — Other Ambulatory Visit (HOSPITAL_COMMUNITY)
Admission: RE | Admit: 2013-04-23 | Discharge: 2013-04-23 | Disposition: A | Discharge status: Home / Self Care | Payer: BC Managed Care – PPO | Source: Ambulatory Visit | Attending: Family Medicine | Admitting: Family Medicine

## 2013-04-23 ENCOUNTER — Inpatient Hospital Stay (HOSPITAL_COMMUNITY): Payer: BC Managed Care – PPO

## 2013-04-23 ENCOUNTER — Encounter (HOSPITAL_COMMUNITY): Payer: Self-pay | Admitting: *Deleted

## 2013-04-23 DIAGNOSIS — R1032 Left lower quadrant pain: Secondary | ICD-10-CM | POA: Insufficient documentation

## 2013-04-23 DIAGNOSIS — R102 Pelvic and perineal pain: Secondary | ICD-10-CM

## 2013-04-23 DIAGNOSIS — N760 Acute vaginitis: Secondary | ICD-10-CM | POA: Insufficient documentation

## 2013-04-23 DIAGNOSIS — N72 Inflammatory disease of cervix uteri: Secondary | ICD-10-CM | POA: Insufficient documentation

## 2013-04-23 DIAGNOSIS — Z113 Encounter for screening for infections with a predominantly sexual mode of transmission: Secondary | ICD-10-CM | POA: Insufficient documentation

## 2013-04-23 DIAGNOSIS — N949 Unspecified condition associated with female genital organs and menstrual cycle: Secondary | ICD-10-CM

## 2013-04-23 LAB — POCT URINALYSIS DIP (DEVICE)
Glucose, UA: NEGATIVE mg/dL
Ketones, ur: NEGATIVE mg/dL
Protein, ur: NEGATIVE mg/dL
Specific Gravity, Urine: >=1.03 (ref 1.005–1.030)

## 2013-04-23 LAB — POCT PREGNANCY, URINE: Preg Test, Ur: NEGATIVE

## 2013-04-23 LAB — WET PREP, GENITAL
Clue Cells Wet Prep HPF POC: NONE SEEN
Yeast Wet Prep HPF POC: NONE SEEN

## 2013-04-23 MED ORDER — AZITHROMYCIN 250 MG PO TABS
1000.0000 mg | ORAL_TABLET | Freq: Once | ORAL | Status: AC
  Administered 2013-04-23: 1000 mg via ORAL
  Filled 2013-04-23: qty 4

## 2013-04-23 MED ORDER — KETOROLAC TROMETHAMINE 60 MG/2ML IM SOLN
60.0000 mg | Freq: Once | INTRAMUSCULAR | Status: AC
  Administered 2013-04-23: 60 mg via INTRAMUSCULAR
  Filled 2013-04-23: qty 2

## 2013-04-23 MED ORDER — CEFTRIAXONE SODIUM 250 MG IJ SOLR
250.0000 mg | Freq: Once | INTRAMUSCULAR | Status: AC
  Administered 2013-04-23: 250 mg via INTRAMUSCULAR
  Filled 2013-04-23: qty 250

## 2013-04-23 NOTE — MAU Note (Signed)
Patient states she went to Urgent Care today for abdominal pain. States she was sent to MAU for further evaluation. Denies bleeding or discharge at this time. States the same thing happened about one month ago after intercourse and was treated with antibiotics.

## 2013-04-23 NOTE — ED Notes (Signed)
Pt c/o pelvic pain with discharge x 5 days. Pt denies dysuria and urgency. Pt states no meds taken for sxs. Jan Ranson, SMA

## 2013-04-23 NOTE — ED Provider Notes (Addendum)
CSN: [id number]     Arrival date & time 04/23/13  1407 History   First MD Initiated Contact with Patient 04/23/13 1425     Chief Complaint  Patient presents with  . Abdominal Pain   (Consider location/radiation/quality/duration/timing/severity/associated sxs/prior Treatment) Patient is a 22 y.o. female presenting with abdominal pain. The history is provided by the patient.  Abdominal Pain This is a new problem. The current episode started more than 2 days ago (started bcp on sun , had intercourse on sun, pain began on sun , continues since.). The problem has not changed since onset.Associated symptoms include abdominal pain.    History reviewed. No pertinent past medical history. History reviewed. No pertinent past surgical history. No family history on file. History  Substance Use Topics  . Smoking status: Never Smoker   . Smokeless tobacco: Never Used  . Alcohol Use: No   OB History   Grav Para Term Preterm Abortions TAB SAB Ect Mult Living                 Review of Systems  Constitutional: Negative.   Gastrointestinal: Positive for abdominal pain.  Genitourinary: Positive for pelvic pain. Negative for dysuria, vaginal bleeding, vaginal discharge and menstrual problem.    Allergies  Review of patient's allergies indicates no known allergies.  Home Medications   Current Outpatient Rx  Name  Route  Sig  Dispense  Refill  . azithromycin (ZITHROMAX) 250 MG tablet      Take all 4 tabs po stat   4 each   0   . famotidine (PEPCID) 20 MG tablet   Oral   Take 20 mg by mouth 2 (two) times daily.         Marland Kitchen HYDROcodone-acetaminophen (VICODIN) 5-500 MG per tablet   Oral   Take 1-2 tablets by mouth every 6 (six) hours as needed.         . metroNIDAZOLE (FLAGYL) 500 MG tablet   Oral   Take 1 tablet (500 mg total) by mouth 2 (two) times daily. X 7 days   14 tablet   0   . metroNIDAZOLE (METROGEL VAGINAL) 0.75 % vaginal gel   Vaginal   Place 1 Applicatorful  vaginally 2 (two) times daily.   70 g   0   . nitrofurantoin (MACRODANTIN) 100 MG capsule   Oral   Take 1 capsule (100 mg total) by mouth 2 (two) times daily.   14 capsule   0   . norethindrone-ethinyl estradiol (JUNEL FE,GILDESS FE,LOESTRIN FE) 1-20 MG-MCG tablet   Oral   Take 1 tablet by mouth daily.          BP 101/64  Pulse 117  Temp(Src) 98.4 F (36.9 C) (Oral)  Resp 16  SpO2 99%  LMP 04/18/2013 Physical Exam  Nursing note and vitals reviewed. Constitutional: She is oriented to person, place, and time. She appears well-developed and well-nourished.  Abdominal: Soft. Bowel sounds are normal. She exhibits no distension and no mass. There is tenderness. There is no rebound and no guarding.  Genitourinary: Vagina normal and uterus normal. Pelvic exam was performed with patient supine. Uterus is not enlarged and not tender. Cervix exhibits motion tenderness. Cervix exhibits no discharge. Right adnexum displays no mass, no tenderness and no fullness. Left adnexum displays tenderness. Left adnexum displays no mass and no fullness. No erythema, tenderness or bleeding around the vagina. No vaginal discharge found.  Neurological: She is alert and oriented to person, place, and time.  Skin:  Skin is warm and dry.    ED Course  Procedures (including critical care time) Labs Review Labs Reviewed  POCT URINALYSIS DIP (DEVICE) - Abnormal; Notable for the following:    Hgb urine dipstick TRACE (*)    Leukocytes, UA TRACE (*)    All other components within normal limits  POCT PREGNANCY, URINE  CERVICOVAGINAL ANCILLARY ONLY   Imaging Review No results found.  MDM  U/a and upreg neg    Linna Hoff, MD 04/23/13 1514  Linna Hoff, MD 04/23/13 [id number]

## 2013-04-23 NOTE — MAU Note (Signed)
C/o at least 2 episodes of pain with intercourse and pain following intercourse; first episode was about 1 month ago and the pt was given antibiotics;

## 2013-04-23 NOTE — MAU Provider Note (Signed)
History     CSN: [id number]  Arrival date and time: 04/23/13 1711   None     Chief Complaint  Patient presents with  . Abdominal Pain   HPI Pt is not pregnant and presents with lower abdominal mostly LLQ.  The pain is present all the time.  Onset of pain was 9/06.  The pain  Has dimished since that time.  The pain started with intercourse and after.  Pt is on OCs Junel FE from doctor in [place].  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 Pt has hx of chlamydia 04/2012.  Pt denies nausea or vomitnig, fever or chills, denies UTI sx- pt has had regular bowel movments. Pt's LMP was 9/1/1-24.  Pt denies vaginal discharge.  Pt was seen at Urgent care earlier today and they thought that it might be a ovarian cyst. Pt was off OCs last month and just restarted new pack. Pt is has not taken anything for pain.  Pt's pain level is 5/10 now- it has been 8-9/10.  Pt wants something for pain now.  RN note: Registered Nurse Signed MAU Note Service date: 04/23/2013 5:20 PM   Patient states she went to Urgent Care today for abdominal pain. States she was sent to MAU for further evaluation. Denies bleeding or discharge at this time. States the same thing happened about one month ago after intercourse and was treated with antibiotics.        History reviewed. No pertinent past medical history.  History reviewed. No pertinent past surgical history.  Family History  Problem Relation Age of Onset  . Diabetes Mother   . Diabetes Maternal Aunt     History  Substance Use Topics  . Smoking status: Never Smoker   . Smokeless tobacco: Never Used  . Alcohol Use: No    Allergies: No Known Allergies  Prescriptions prior to admission  Medication Sig Dispense Refill  . azithromycin (ZITHROMAX) 250 MG tablet Take all 4 tabs po stat  4 each  0  . famotidine (PEPCID) 20 MG tablet Take 20 mg by mouth 2 (two) times daily.      Marland Kitchen HYDROcodone-acetaminophen (VICODIN) 5-500 MG per tablet Take  1-2 tablets by mouth every 6 (six) hours as needed.      . metroNIDAZOLE (FLAGYL) 500 MG tablet Take 1 tablet (500 mg total) by mouth 2 (two) times daily. X 7 days  14 tablet  0  . metroNIDAZOLE (METROGEL VAGINAL) 0.75 % vaginal gel Place 1 Applicatorful vaginally 2 (two) times daily.  70 g  0  . nitrofurantoin (MACRODANTIN) 100 MG capsule Take 1 capsule (100 mg total) by mouth 2 (two) times daily.  14 capsule  0  . norethindrone-ethinyl estradiol (JUNEL FE,GILDESS FE,LOESTRIN FE) 1-20 MG-MCG tablet Take 1 tablet by mouth daily.        Review of Systems  Constitutional: Negative for fever and chills.  Gastrointestinal: Positive for abdominal pain. Negative for nausea, vomiting, diarrhea and constipation.  Genitourinary: Negative for dysuria and urgency.   Physical Exam   Blood pressure 107/56, pulse 68, temperature 98.3 F (36.8 C), resp. rate 18, height 5\' 2"  (1.575 m), weight 72.757 kg (160 lb 6.4 oz), last menstrual period 04/13/2013, SpO2 100.00%.  Physical Exam  Nursing note and vitals reviewed. Constitutional: She is oriented to person, place, and time. She appears well-developed and well-nourished. No distress.  HENT:  Head: Normocephalic.  Eyes: Pupils are equal, round, and reactive to light.  Neck: Normal range  of motion. Neck supple.  Cardiovascular: Normal rate.   Respiratory: Effort normal.  GI: Soft. She exhibits no distension. There is tenderness. There is no rebound.  Musculoskeletal: Normal range of motion.  Neurological: She is alert and oriented to person, place, and time.  Skin: Skin is warm and dry.  Psychiatric: She has a normal mood and affect.    MAU Course  Procedures Results for orders placed during the hospital encounter of 04/23/13 (from the past 24 hour(s))  POCT URINALYSIS DIP (DEVICE)     Status: Abnormal   Collection Time    04/23/13  3:28 PM      Result Value Range   Glucose, UA NEGATIVE  NEGATIVE mg/dL   Bilirubin Urine NEGATIVE  NEGATIVE    Ketones, ur NEGATIVE  NEGATIVE mg/dL   Specific Gravity, Urine >=1.030  1.005 - 1.030   Hgb urine dipstick TRACE (*) NEGATIVE   pH 5.5  5.0 - 8.0   Protein, ur NEGATIVE  NEGATIVE mg/dL   Urobilinogen, UA 0.2  0.0 - 1.0 mg/dL   Nitrite NEGATIVE  NEGATIVE   Leukocytes, UA TRACE (*) NEGATIVE  POCT PREGNANCY, URINE     Status: None   Collection Time    04/23/13  3:30 PM      Result Value Range   Preg Test, Ur NEGATIVE  NEGATIVE   Results for orders placed during the hospital encounter of 04/23/13 (from the past 24 hour(s))  WET PREP, GENITAL     Status: Abnormal   Collection Time    04/23/13  5:56 PM      Result Value Range   Yeast Wet Prep HPF POC NONE SEEN  NONE SEEN   Trich, Wet Prep NONE SEEN  NONE SEEN   Clue Cells Wet Prep HPF POC NONE SEEN  NONE SEEN   WBC, Wet Prep HPF POC MANY (*) NONE SEEN   Toradol 60mg  IM given Rocephin 250mg  IM and Zithromax 1 gm for cervicitis Care turned over to Thressa Sheller, CNM  US Transvaginal Non-ob  04/23/2013   , *RADIOLOGY REPORT*  Clinical Data: Pelvic pain  TRANSABDOMINAL AND TRANSVAGINAL ULTRASOUND OF PELVIS  Technique:  Both transabdominal and transvaginal ultrasound examinations of the pelvis were performed.  Transabdominal technique was performed for global imaging of the pelvis including uterus, ovaries, adnexal regions, and pelvic cul-de-sac.  It was necessary to proceed with endovaginal exam following the transabdominal exam to visualize the endometrium and uterus.  Comparison:  05/08/2012  Findings: Uterus:  Normal in size and appearance.  Measures 6.3 x 3.0 x 4.2 cm.  Endometrium: Normal in thickness and appearance.  Measures 5.4 mm.  Right ovary: Normal appearance/no adnexal mass.  Measures 2.2 x 1.5 x 1.4 cm.  Left ovary: Normal appearance/no adnexal mass.  Measures 2.3 x 1.3 x 1.9 cm.  Other Findings:  No free fluid  IMPRESSION: Normal study.  No evidence of pelvic mass or other significant abnormality.   Original Report Authenticated By:  Signa Kell, M.D.   US Pelvis Complete  04/23/2013   , *RADIOLOGY REPORT*  Clinical Data: Pelvic pain  TRANSABDOMINAL AND TRANSVAGINAL ULTRASOUND OF PELVIS  Technique:  Both transabdominal and transvaginal ultrasound examinations of the pelvis were performed.  Transabdominal technique was performed for global imaging of the pelvis including uterus, ovaries, adnexal regions, and pelvic cul-de-sac.  It was necessary to proceed with endovaginal exam following the transabdominal exam to visualize the endometrium and uterus.  Comparison:  05/08/2012  Findings: Uterus:  Normal in size  and appearance.  Measures 6.3 x 3.0 x 4.2 cm.  Endometrium: Normal in thickness and appearance.  Measures 5.4 mm.  Right ovary: Normal appearance/no adnexal mass.  Measures 2.2 x 1.5 x 1.4 cm.  Left ovary: Normal appearance/no adnexal mass.  Measures 2.3 x 1.3 x 1.9 cm.  Other Findings:  No free fluid  IMPRESSION: Normal study.  No evidence of pelvic mass or other significant abnormality.   Original Report Authenticated By: Signa Kell, M.D.     Assessment and Plan   1. Pelvic pain    Treated with 250mg  rocephin and 1 gram Azithromycin here in MAU GC/CT pending FU as needed  LINEBERRY,SUSAN 04/23/2013, 5:40 PM

## 2013-04-23 NOTE — MAU Note (Signed)
C/o pelvic pain for past 5 days;

## 2013-04-24 LAB — GC/CHLAMYDIA PROBE AMP: CT Probe RNA: NEGATIVE

## 2013-05-04 NOTE — MAU Provider Note (Signed)
Attestation of Attending Supervision of Advanced Practitioner (CNM/NP): Evaluation and management procedures were performed by the Advanced Practitioner under my supervision and collaboration. I have reviewed the Advanced Practitioner's note and chart, and I agree with the management and plan.  Ameerah Huffstetler H. 2:01 PM

## 2013-09-21 ENCOUNTER — Emergency Department (HOSPITAL_COMMUNITY)
Admission: EM | Admit: 2013-09-21 | Discharge: 2013-09-21 | Disposition: A | Discharge status: Home / Self Care | Payer: BC Managed Care – PPO | Source: Home / Self Care | Attending: Family Medicine | Admitting: Family Medicine

## 2013-09-21 ENCOUNTER — Other Ambulatory Visit (HOSPITAL_COMMUNITY)
Admission: RE | Admit: 2013-09-21 | Discharge: 2013-09-21 | Disposition: A | Discharge status: Home / Self Care | Payer: BC Managed Care – PPO | Source: Ambulatory Visit | Attending: Family Medicine | Admitting: Family Medicine

## 2013-09-21 ENCOUNTER — Encounter (HOSPITAL_COMMUNITY): Payer: Self-pay | Admitting: Emergency Medicine

## 2013-09-21 DIAGNOSIS — B3731 Acute candidiasis of vulva and vagina: Secondary | ICD-10-CM

## 2013-09-21 DIAGNOSIS — N760 Acute vaginitis: Secondary | ICD-10-CM | POA: Insufficient documentation

## 2013-09-21 DIAGNOSIS — B373 Candidiasis of vulva and vagina: Secondary | ICD-10-CM

## 2013-09-21 DIAGNOSIS — Z113 Encounter for screening for infections with a predominantly sexual mode of transmission: Secondary | ICD-10-CM | POA: Insufficient documentation

## 2013-09-21 LAB — POCT URINALYSIS DIP (DEVICE)
BILIRUBIN URINE: NEGATIVE
GLUCOSE, UA: NEGATIVE mg/dL
HGB URINE DIPSTICK: NEGATIVE
KETONES UR: NEGATIVE mg/dL
Nitrite: NEGATIVE
Protein, ur: NEGATIVE mg/dL
UROBILINOGEN UA: 1 mg/dL (ref 0.0–1.0)
pH: 6 (ref 5.0–8.0)

## 2013-09-21 LAB — POCT PREGNANCY, URINE: PREG TEST UR: NEGATIVE

## 2013-09-21 MED ORDER — FLUCONAZOLE 150 MG PO TABS: 150.0000 mg | ORAL_TABLET | Freq: Once | ORAL | Status: DC

## 2013-09-21 NOTE — ED Provider Notes (Signed)
Kristine FlemingsJanea Ho is a 23 y.o. female who presents to Urgent Care today for vulvar and vaginal pain. This is been present for one month and is associated with mild itching and discharge. No medications tried. No fevers or chills nausea vomiting or diarrhea.   History reviewed. No pertinent past medical history. History  Substance Use Topics  . Smoking status: Never Smoker   . Smokeless tobacco: Never Used  . Alcohol Use: No   ROS as above Medications: No current facility-administered medications for this encounter.   Current Outpatient Prescriptions  Medication Sig Dispense Refill  . fluconazole (DIFLUCAN) 150 MG tablet Take 1 tablet (150 mg total) by mouth once.  1 tablet  1  . norethindrone-ethinyl estradiol (JUNEL FE,GILDESS FE,LOESTRIN FE) 1-20 MG-MCG tablet Take 1 tablet by mouth daily.        Exam:  BP 103/68  Pulse 64  Temp(Src) 98.5 F (36.9 C) (Oral)  Resp 16  SpO2 98% Gen: Well NAD GYN: Inflamed external genitalia with mild white discharge present. No vesicular lesions noted. Vaginal canal with clumpy white discharge. Normal-appearing cervix. No cervical motion tenderness.  Results for orders placed during the hospital encounter of 09/21/13 (from the past 24 hour(s))  POCT URINALYSIS DIP (DEVICE)     Status: Abnormal   Collection Time    09/21/13  1:26 PM      Result Value Range   Glucose, UA NEGATIVE  NEGATIVE mg/dL   Bilirubin Urine NEGATIVE  NEGATIVE   Ketones, ur NEGATIVE  NEGATIVE mg/dL   Specific Gravity, Urine >=1.030  1.005 - 1.030   Hgb urine dipstick NEGATIVE  NEGATIVE   pH 6.0  5.0 - 8.0   Protein, ur NEGATIVE  NEGATIVE mg/dL   Urobilinogen, UA 1.0  0.0 - 1.0 mg/dL   Nitrite NEGATIVE  NEGATIVE   Leukocytes, UA TRACE (*) NEGATIVE  POCT PREGNANCY, URINE     Status: None   Collection Time    09/21/13  1:31 PM      Result Value Range   Preg Test, Ur NEGATIVE  NEGATIVE   No results found.  Assessment and Plan: 23 y.o. female with likely candidal  vaginitis. Plan to treat fluconazole. Cytology for gonorrhea, Chlamydia, trichomonas, BV and yeast are pending.  Discussed warning signs or symptoms. Please see discharge instructions. Patient expresses understanding.    Rodolph BongEvan S Renn Stille, MD 09/21/13 1352

## 2013-09-21 NOTE — ED Notes (Signed)
PT  HA S  SYMPTOMS  OF  VAGINAL IRRITATION  WITH  DISCOMFORT  AS  WELL  AS  A  DISCHARGE     FOR  ABOUT 1  MONTH          SHE   REPORTS   SHE  WAS  TOLD  SHE HAD  HERPES      BY A  DR  ABOUT 1  MONTH  AGO  AND  SHE TOOK MEDS  FOR  THAT

## 2013-09-21 NOTE — Discharge Instructions (Signed)
Take fluconazole once. You can repeat in one week as needed I will call you with test results if abnormal  Candidal Vulvovaginitis Candidal vulvovaginitis is an infection of the vagina and vulva. The vulva is the skin around the opening of the vagina. This may cause itching and discomfort in and around the vagina.  HOME CARE  Only take medicine as told by your doctor.  Do not have sex (intercourse) until the infection is healed or as told by your doctor.  Practice safe sex.  Tell your sex partner about your infection.  Do not douche or use tampons.  Wear cotton underwear. Do not wear tight pants or panty hose.  Eat yogurt. This may help treat and prevent yeast infections. GET HELP RIGHT AWAY IF:   You have a fever.  Your problems get worse during treatment or do not get better in 3 days.  You have discomfort, irritation, or itching in your vagina or vulva area.  You have pain after sex.  You start to get belly (abdominal) pain. MAKE SURE YOU:  Understand these instructions.  Will watch your condition.  Will get help right away if you are not doing well or get worse. Document Released: 10/26/2008 Document Revised: 10/22/2011 Document Reviewed: 10/26/2008 [facility] Patient Information 2014 [employer], [place].

## 2013-09-24 NOTE — ED Notes (Signed)
GC/Chlamydia neg., Affirm: Candida pos., Gardnerella and Trich neg.  Pt. adequately treated with Diflucan. Vassie MoselleYork, Malaka Ruffner M 09/24/2013

## 2014-02-16 ENCOUNTER — Emergency Department (HOSPITAL_COMMUNITY)
Admission: EM | Admit: 2014-02-16 | Discharge: 2014-02-16 | Disposition: A | Discharge status: Home / Self Care | Payer: BC Managed Care – PPO | Source: Home / Self Care | Attending: Family Medicine | Admitting: Family Medicine

## 2014-02-16 ENCOUNTER — Encounter (HOSPITAL_COMMUNITY): Payer: Self-pay | Admitting: Emergency Medicine

## 2014-02-16 DIAGNOSIS — R21 Rash and other nonspecific skin eruption: Secondary | ICD-10-CM

## 2014-02-16 MED ORDER — CLOTRIMAZOLE-BETAMETHASONE 1-0.05 % EX CREA: TOPICAL_CREAM | CUTANEOUS | Status: DC

## 2014-02-16 NOTE — ED Notes (Signed)
Pt c/o rash on bilateral palms of hand and arms x1 month Does not itch or cause any pain Alert w/no signs of acute distress.

## 2014-02-16 NOTE — ED Provider Notes (Signed)
CSN: [id number]     Arrival date & time 02/16/14  1605 History   First MD Initiated Contact with Patient 02/16/14 1644     Chief Complaint  Patient presents with  . Rash   (Consider location/radiation/quality/duration/timing/severity/associated sxs/prior Treatment) HPI Comments: 23 year old female presents complaining of a rash on her bilateral palms of her hands as well as her arms, and one small spot on her left leg, for one month. This first began as a small spot on her left palm and has since spread to the other areas. It seemed to be getting worse until about 2 weeks ago and now seems to be eating better, I be very slowly. The rash was itchy but is now just dry and cosmetically appealing. She denies any systemic symptoms associated with this. She denies any contact with a similar rash. She denies any known exposure to syphilis, although she is sexually active. She has not attempted any treatment for this at home.  Patient is a 23 y.o. female presenting with rash.  Rash   History reviewed. No pertinent past medical history. History reviewed. No pertinent past surgical history. Family History  Problem Relation Age of Onset  . Diabetes Mother   . Diabetes Maternal Aunt    History  Substance Use Topics  . Smoking status: Never Smoker   . Smokeless tobacco: Never Used  . Alcohol Use: No   OB History   Grav Para Term Preterm Abortions TAB SAB Ect Mult Living   0              Review of Systems  Skin: Positive for rash.  All other systems reviewed and are negative.   Allergies  Review of patient's allergies indicates no known allergies.  Home Medications   Prior to Admission medications   Medication Sig Start Date End Date Taking? Authorizing Provider  clotrimazole-betamethasone (LOTRISONE) cream Apply to affected area 2 times daily prn 02/16/14   Graylon GoodZachary H Guhan Bruington, PA-C  fluconazole (DIFLUCAN) 150 MG tablet Take 1 tablet (150 mg total) by mouth once. 09/21/13   Rodolph BongEvan S Corey, MD   norethindrone-ethinyl estradiol (JUNEL FE,GILDESS FE,LOESTRIN FE) 1-20 MG-MCG tablet Take 1 tablet by mouth daily.    Historical Provider, MD   BP 107/62  Pulse 62  Temp(Src) 98.6 F (37 C) (Oral)  Resp 14  SpO2 100%  LMP 01/26/2014 Physical Exam  Nursing note and vitals reviewed. Constitutional: She is oriented to person, place, and time. Vital signs are normal. She appears well-developed and well-nourished. No distress.  HENT:  Head: Normocephalic and atraumatic.  Pulmonary/Chest: Effort normal. No respiratory distress.  Neurological: She is alert and oriented to person, place, and time. She has normal strength. Coordination normal.  Skin: Skin is warm and dry. Rash (erythematous, scaling rash in individual 1-2 cm patches, excoriated, not circular , nontender) noted. She is not diaphoretic.  Psychiatric: She has a normal mood and affect. Judgment normal.    ED Course  Procedures (including critical care time) Labs Review Labs Reviewed  RPR    Imaging Review No results found.   MDM   1. Rash    RPR sent to rule out syphilis as the cause of his palm or rash. We'll treat with Lotrisone for the time being for possible contact dermatitis or rash similar to tinea corporis. Followup if no improvement in a few days.  Meds ordered this encounter  Medications  . clotrimazole-betamethasone (LOTRISONE) cream    Sig: Apply to affected area 2 times daily  prn    Dispense:  90 g    Refill:  0    Order Specific Question:  Supervising Provider    Answer:  Bradd CanaryKINDL, JAMES D [[id number]]       Graylon GoodZachary H Minh Roanhorse, PA-C 02/16/14 1705

## 2014-02-16 NOTE — Discharge Instructions (Signed)

## 2014-02-17 LAB — RPR

## 2014-02-17 NOTE — ED Provider Notes (Signed)
Medical screening examination/treatment/procedure(s) were performed by resident physician or non-physician practitioner and as supervising physician I was immediately available for consultation/collaboration.   Javian Nudd DOUGLAS MD.   Dorena Dorfman D Cortlan Dolin, MD 02/17/14 2057 

## 2014-03-01 ENCOUNTER — Inpatient Hospital Stay (HOSPITAL_COMMUNITY)
Admission: AD | Admit: 2014-03-01 | Discharge: 2014-03-01 | Disposition: A | Discharge status: Home / Self Care | Payer: BC Managed Care – PPO | Source: Ambulatory Visit | Attending: Obstetrics & Gynecology | Admitting: Obstetrics & Gynecology

## 2014-03-01 ENCOUNTER — Encounter (HOSPITAL_COMMUNITY): Payer: Self-pay | Admitting: *Deleted

## 2014-03-01 DIAGNOSIS — N949 Unspecified condition associated with female genital organs and menstrual cycle: Secondary | ICD-10-CM | POA: Insufficient documentation

## 2014-03-01 DIAGNOSIS — A609 Anogenital herpesviral infection, unspecified: Secondary | ICD-10-CM

## 2014-03-01 DIAGNOSIS — B373 Candidiasis of vulva and vagina: Secondary | ICD-10-CM

## 2014-03-01 DIAGNOSIS — B3731 Acute candidiasis of vulva and vagina: Secondary | ICD-10-CM | POA: Insufficient documentation

## 2014-03-01 DIAGNOSIS — B009 Herpesviral infection, unspecified: Secondary | ICD-10-CM

## 2014-03-01 DIAGNOSIS — A6000 Herpesviral infection of urogenital system, unspecified: Secondary | ICD-10-CM | POA: Insufficient documentation

## 2014-03-01 HISTORY — DX: Pelvic and perineal pain: R10.2

## 2014-03-01 HISTORY — DX: Pelvic and perineal pain unspecified side: R10.20

## 2014-03-01 HISTORY — DX: Female pelvic inflammatory disease, unspecified: N73.9

## 2014-03-01 LAB — WET PREP, GENITAL
CLUE CELLS WET PREP: NONE SEEN
TRICH WET PREP: NONE SEEN

## 2014-03-01 LAB — URINALYSIS, ROUTINE W REFLEX MICROSCOPIC
BILIRUBIN URINE: NEGATIVE
Glucose, UA: NEGATIVE mg/dL
Hgb urine dipstick: NEGATIVE
Ketones, ur: NEGATIVE mg/dL
NITRITE: NEGATIVE
PH: 6 (ref 5.0–8.0)
Protein, ur: NEGATIVE mg/dL
SPECIFIC GRAVITY, URINE: 1.025 (ref 1.005–1.030)
UROBILINOGEN UA: 0.2 mg/dL (ref 0.0–1.0)

## 2014-03-01 LAB — URINE MICROSCOPIC-ADD ON

## 2014-03-01 LAB — POCT PREGNANCY, URINE: Preg Test, Ur: NEGATIVE

## 2014-03-01 MED ORDER — PROMETHAZINE HCL 25 MG PO TABS
25.0000 mg | ORAL_TABLET | Freq: Once | ORAL | Status: AC
Start: 2014-03-01 — End: 2014-03-01
  Administered 2014-03-01: 25 mg via ORAL
  Filled 2014-03-01: qty 1

## 2014-03-01 MED ORDER — OXYCODONE-ACETAMINOPHEN 5-325 MG PO TABS: 2.0000 | ORAL_TABLET | ORAL | Status: DC | PRN

## 2014-03-01 MED ORDER — OXYCODONE-ACETAMINOPHEN 5-325 MG PO TABS
1.0000 | ORAL_TABLET | Freq: Once | ORAL | Status: AC
  Administered 2014-03-01: 1 via ORAL
  Filled 2014-03-01: qty 1

## 2014-03-01 MED ORDER — VALACYCLOVIR HCL 1 G PO TABS: 1000.0000 mg | ORAL_TABLET | Freq: Every day | ORAL | Status: AC

## 2014-03-01 MED ORDER — OXYCODONE-ACETAMINOPHEN 5-325 MG PO TABS: 2.0000 | ORAL_TABLET | ORAL | Status: AC | PRN

## 2014-03-01 MED ORDER — FLUCONAZOLE 150 MG PO TABS: 150.0000 mg | ORAL_TABLET | Freq: Every day | ORAL | Status: AC

## 2014-03-01 MED ORDER — PROMETHAZINE HCL 25 MG PO TABS: 25.0000 mg | ORAL_TABLET | Freq: Four times a day (QID) | ORAL | Status: AC | PRN

## 2014-03-01 NOTE — MAU Note (Signed)
Ongoing problem with pelvic pain. Over a year.  Has been seen, had US and testing - told if continued or returned needed to check into clinic- never did

## 2014-03-01 NOTE — MAU Provider Note (Signed)
History     CSN: [id number]  Arrival date and time: 03/01/14 1434   First Provider Initiated Contact with Patient 03/01/14 1535      Chief Complaint  Patient presents with  . Pelvic Pain   Patient is a 23 y.o. female presenting with pelvic pain.  Pelvic Pain   Kristine Ho is a 23 y/o G0P0 with a PMH of HSV-2 and PID who presents with lower left abdominopelvic pain that has become more painful in the past 5 days. Patient states that pain is constant and achy with moderate severity. Patient states that pain has been intermittent ranging from mild to severe since resolution of her PID approximately one year prior to today's visit. She currently is sexually active with one mail partner with whom she endorses condom use 100% of the time. Patient normally uses combined oral contraceptive pills and condoms but has not taken OCP's in the past few weeks. She states that intercourse, tampons, and external pressure leads to increase pain that slowly resolves with complete pelvic rest. Only rest seems to resolve discomfort.   Patient states that she does not currently have medication for her HSV but has previously been prescribed such medication.   Patient denies; fever, chills, N/V, frequency, urgency, dysuria, vaginal itching, vaginal bleeding, vaginal discharge, or back ache.     Past Medical History  Diagnosis Date  . Pelvic pain in female   . PID (pelvic inflammatory disease)     July of 2014, treated    History reviewed. No pertinent past surgical history.  Family History  Problem Relation Age of Onset  . Diabetes Mother   . Diabetes Maternal Aunt     History  Substance Use Topics  . Smoking status: Never Smoker   . Smokeless tobacco: Never Used  . Alcohol Use: Yes     Comment: occasionally    Allergies: No Known Allergies  Prescriptions prior to admission  Medication Sig Dispense Refill  . clotrimazole-betamethasone (LOTRISONE) cream Apply to affected area 2 times  daily prn  90 g  0  . norethindrone-ethinyl estradiol (JUNEL FE,GILDESS FE,LOESTRIN FE) 1-20 MG-MCG tablet Take 1 tablet by mouth daily.      . fluconazole (DIFLUCAN) 150 MG tablet Take 1 tablet (150 mg total) by mouth once.  1 tablet  1    Review of Systems  Genitourinary: Positive for pelvic pain.   Physical Exam   Blood pressure 99/62, pulse 72, temperature 98.7 F (37.1 C), temperature source Oral, resp. rate 18, height 5\' 2"  (1.575 m), weight 148 lb (67.132 kg), last menstrual period 02/11/2014, SpO2 100.00%.  Physical Exam  Constitutional: She is oriented to person, place, and time. She appears well-developed and well-nourished.  HENT:  Head: Normocephalic and atraumatic.  Mouth/Throat: Oropharynx is clear and moist.  Respiratory: Effort normal and breath sounds normal.  GI: Soft. Normal appearance and bowel sounds are normal. She exhibits no distension and no mass. There is no tenderness. There is no rebound and no guarding.    Genitourinary: There is lesion on the right labia. No vaginal discharge found.  Genital:left labia several hsv lesions Vaginal:small amount thick clumpy discharge Cervix:closed/ thick Bimanual:not done due to external outbreak   Lymphadenopathy:       Left: Inguinal adenopathy present.  Neurological: She is alert and oriented to person, place, and time.    MAU Course  Procedures  Recent Results (from the past 2160 hour(s))  RPR     Status: None   Collection Time  02/16/14  4:45 PM      Result Value Ref Range   RPR NON REAC  NON REAC   Comment: Performed at [facility]  URINALYSIS, ROUTINE W REFLEX MICROSCOPIC     Status: Abnormal   Collection Time    03/01/14  3:15 PM      Result Value Ref Range   Color, Urine YELLOW  YELLOW   APPearance CLEAR  CLEAR   Specific Gravity, Urine 1.025  1.005 - 1.030   pH 6.0  5.0 - 8.0   Glucose, UA NEGATIVE  NEGATIVE mg/dL   Hgb urine dipstick NEGATIVE  NEGATIVE   Bilirubin Urine NEGATIVE   NEGATIVE   Ketones, ur NEGATIVE  NEGATIVE mg/dL   Protein, ur NEGATIVE  NEGATIVE mg/dL   Urobilinogen, UA 0.2  0.0 - 1.0 mg/dL   Nitrite NEGATIVE  NEGATIVE   Leukocytes, UA SMALL (*) NEGATIVE  URINE MICROSCOPIC-ADD ON     Status: Abnormal   Collection Time    03/01/14  3:15 PM      Result Value Ref Range   Squamous Epithelial / LPF FEW (*) RARE   WBC, UA 0-2  <3 WBC/hpf   RBC / HPF 0-2  <3 RBC/hpf   Bacteria, UA MANY (*) RARE   Urine-Other MUCOUS PRESENT    POCT PREGNANCY, URINE     Status: None   Collection Time    03/01/14  3:18 PM      Result Value Ref Range   Preg Test, Ur NEGATIVE  NEGATIVE   Comment:            THE SENSITIVITY OF THIS     METHODOLOGY IS >24 mIU/mL  WET PREP, GENITAL     Status: Abnormal   Collection Time    03/01/14  4:35 PM      Result Value Ref Range   Yeast Wet Prep HPF POC MODERATE (*) NONE SEEN   Trich, Wet Prep NONE SEEN  NONE SEEN   Clue Cells Wet Prep HPF POC NONE SEEN  NONE SEEN   WBC, Wet Prep HPF POC MANY (*) NONE SEEN   Comment: MANY BACTERIA SEEN    Assessment and Plan  A: HSV2 Vaginal Yeast  P: valtrex 1 Gram po q day Diflucan 150 mg po  Urine culture Percocet/ phenergan for this outbreak Given list of GYN providers in [place] for care     Annita Brod 03/01/2014, 4:14 PM

## 2014-03-01 NOTE — Discharge Instructions (Signed)
Candidal Vulvovaginitis Candidal vulvovaginitis is an infection of the vagina and vulva. The vulva is the skin around the opening of the vagina. This may cause itching and discomfort in and around the vagina.  HOME CARE  Only take medicine as told by your doctor.  Do not have sex (intercourse) until the infection is healed or as told by your doctor.  Practice safe sex.  Tell your sex partner about your infection.  Do not douche or use tampons.  Wear cotton underwear. Do not wear tight pants or panty hose.  Eat yogurt. This may help treat and prevent yeast infections. GET HELP RIGHT AWAY IF:   You have a fever.  Your problems get worse during treatment or do not get better in 3 days.  You have discomfort, irritation, or itching in your vagina or vulva area.  You have pain after sex.  You start to get belly (abdominal) pain. MAKE SURE YOU:  Understand these instructions.  Will watch your condition.  Will get help right away if you are not doing well or get worse. Document Released: 10/26/2008 Document Revised: 08/04/2013 Document Reviewed: 10/26/2008 [facility] Patient Information 2015 [place], [place]. This information is not intended to replace advice given to you by your health care provider. Make sure you discuss any questions you have with your health care provider. Herpes During and After Pregnancy Genital herpes is a sexually transmitted infection (STI). It is caused by a virus and can be very serious during pregnancy. The greatest concern is passing the virus to the fetus and newborn. The virus is more likely to be passed to the newborn during delivery if the mother becomes infected for the first time late in her pregnancy (primary infection). It is less common for the virus to pass to the newborn if the mother had herpes before becoming pregnant. This is because antibodies against the virus develop over a period of time. These antibodies help protect the baby. Lastly, the  infection can pass to the fetus through the placenta. This can happen if the mother gets herpes for the first time in the first 3 months of her pregnancy (first trimester). This can possibly cause a miscarriage or birth defects in the baby. TREATMENT DURING PREGNANCY Medicines may be prescribed that are safe for the mother and the fetus. The medicine can lessen symptoms or prevent a recurrence of the infection. If the infection happened before becoming pregnant, medicine may be prescribed in the last 4 weeks of the pregnancy. This can prevent a recurrent infection at the time of delivery.  RECOMMENDED DELIVERY METHOD If an active, recurrent infection is present at the time delivery, the baby should be delivered by cesarean delivery. This is because the virus can pass to the baby through an infected birth canal. This can cause severe problems for the baby. If the infection happens for the first time late in the pregnancy, the caregiver may also recommend a cesarean delivery. With a new infection, the body has not had the time to build up enough antibodies against the virus to protect the baby from getting the infection. Even if the birth canal does not have visible sores (lesions) from the herpes virus, there is still that chance that the virus can spread to the baby. A cesarean delivery should be done if there is any signs or feeling of an infection being present in the genital area. Cesarean delivery is not recommended for women with a history of herpes infection but no evidence of active genital  lesions at the time of delivery. Lesions that have crusted fully are considered healed and not active. BREASTFEEDING  Women infected with genital herpes can breastfeed their baby. The virus will not be present in the breast milk. If lesions are present on the breast, the baby should not breastfeed from the affected breast(s). HOW TO PREVENT PASSING HERPES TO YOUR BABY AFTER DELIVERY  Wash your hands with soap  and water often and before touching your baby.  If you have an outbreak, keep the area clean and covered.  Try to avoid physical and stressful situations that may bring on an outbreak. SEEK IMMEDIATE MEDICAL CARE IF:   You have an outbreak during pregnancy and cannot urinate.  You have an outbreak anytime during your pregnancy and especially in the last 3 months of the pregnancy.  You think you are having an allergic reaction or side effects from the medicine you are taking. Document Released: 11/05/2000 Document Revised: 10/22/2011 Document Reviewed: 07/10/2011 [facility] Patient Information 2015 [place]. This information is not intended to replace advice given to you by your health care provider. Make sure you discuss any questions you have with your health care provider.

## 2014-03-01 NOTE — MAU Provider Note (Signed)
Attestation of Attending Supervision of Advanced Practitioner (CNM/NP): Evaluation and management procedures were performed by the Advanced Practitioner under my supervision and collaboration.  I have reviewed the Advanced Practitioner's note and chart, and I agree with the management and plan.  HARRAWAY-SMITH, Emerita Berkemeier 6:02 PM

## 2014-03-01 NOTE — MAU Note (Signed)
Pt has had pressure pain in her lower left abdominal area that worsens with intercourse and tampon usage.  LMP ;   11 February 2014.

## 2014-03-02 LAB — URINE CULTURE
COLONY COUNT: NO GROWTH
Culture: NO GROWTH

## 2014-03-02 LAB — GC/CHLAMYDIA PROBE AMP
CT Probe RNA: NEGATIVE
GC Probe RNA: NEGATIVE

## 2014-05-27 IMAGING — US US TRANSVAGINAL NON-OB
1 series · 13 of 25 positions shown · non-contrast
Comparison: None.

CLINICAL DATA: 20-year-old female with left side pelvic pain times
5 days.  Possible torsion.

TRANSABDOMINAL AND TRANSVAGINAL ULTRASOUND OF PELVIS
DOPPLER ULTRASOUND OF OVARIES
TECHNIQUE: Both transabdominal and transvaginal ultrasound
examinations of the pelvis were performed. Transabdominal technique
was performed for global imaging of the pelvis including uterus,
ovaries, adnexal regions, and pelvic cul-de-sac.
It was necessary to proceed with endovaginal exam following the
transabdominal exam to visualize the endometrium and adnexa.
Color and duplex Doppler ultrasound was utilized to evaluate blood
flow to the ovaries.

[Series 1: us transvaginal non-ob · 0.20mm/px · 13 of 44 slices shown]
[im 1/44]
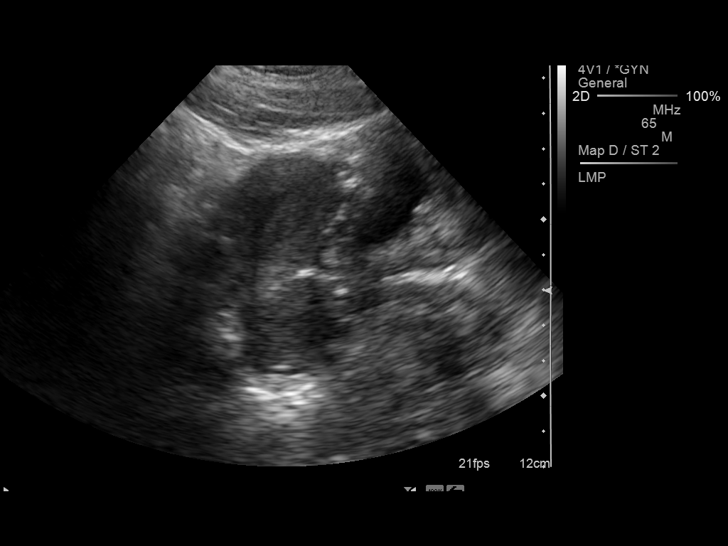
[im 4/44]
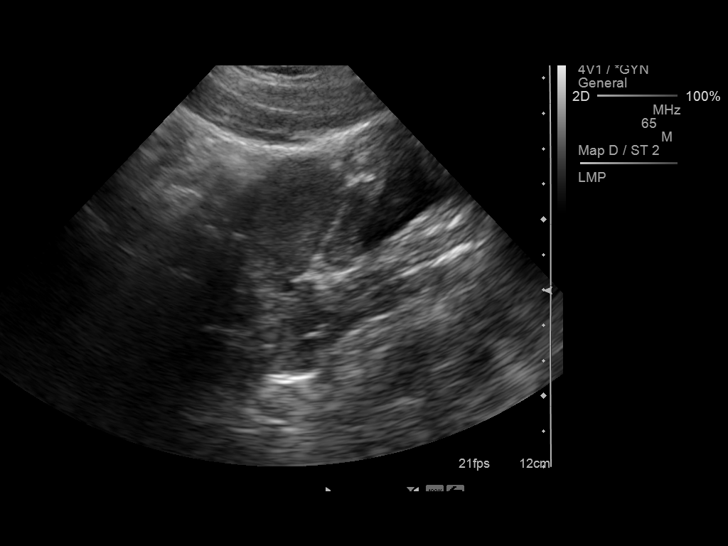
[im 8/44]
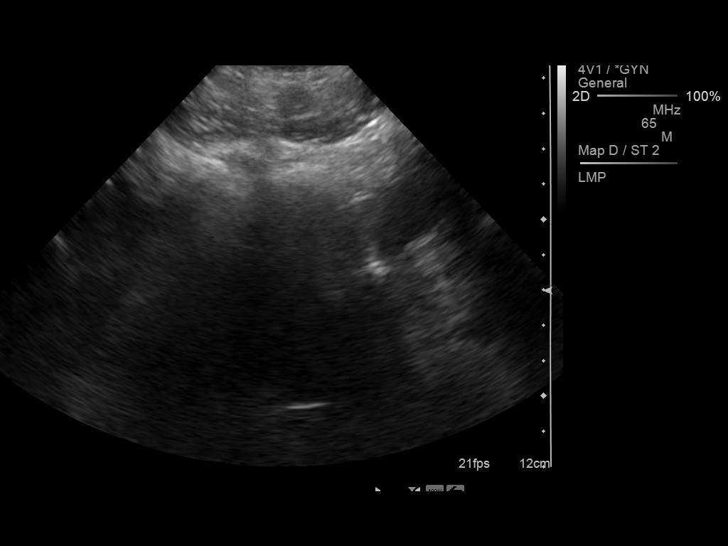
[im 11/44]
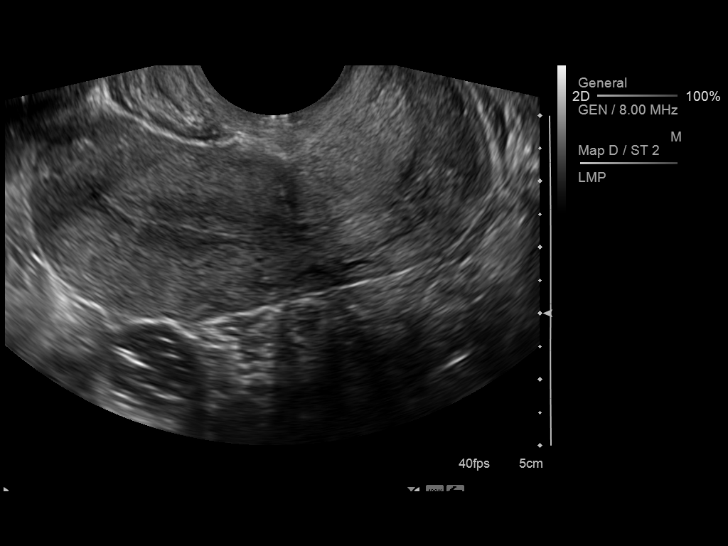
[im 15/44]
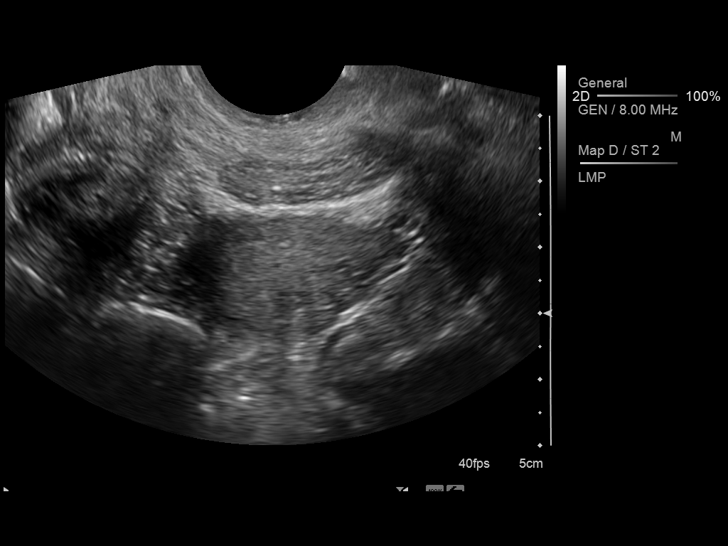
[im 18/44]
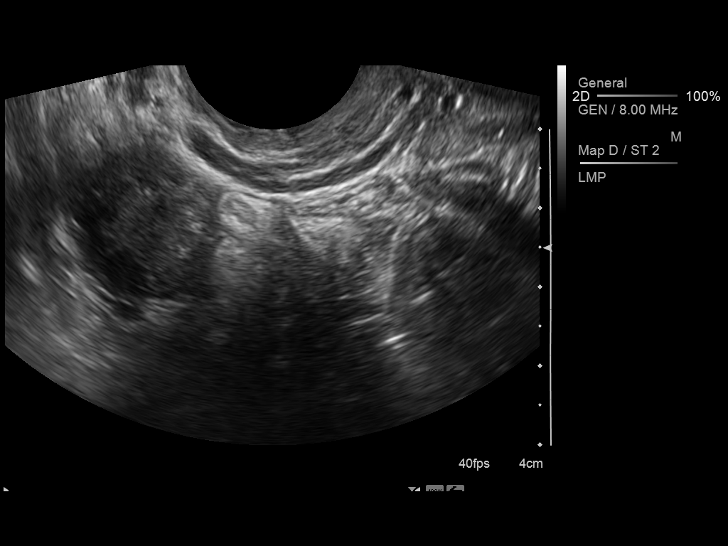
[im 22/44]
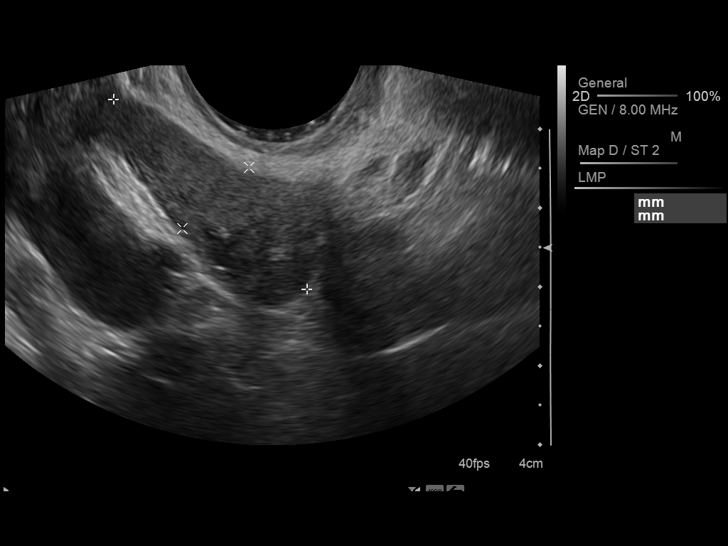
[im 26/44]
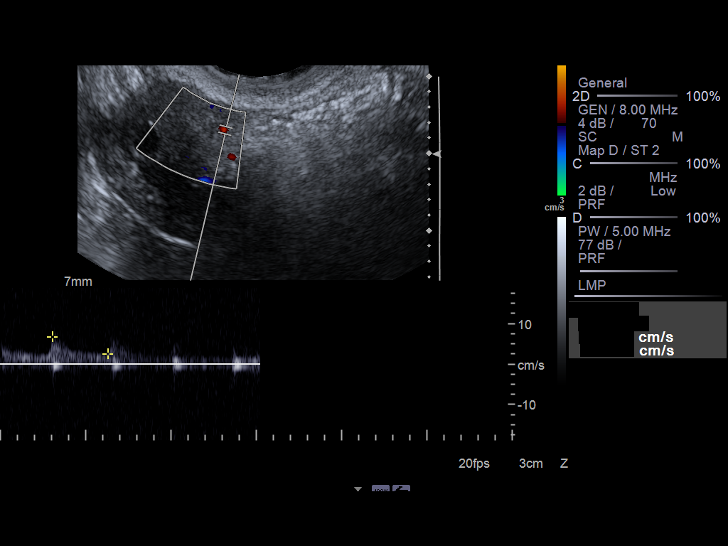
[im 29/44]
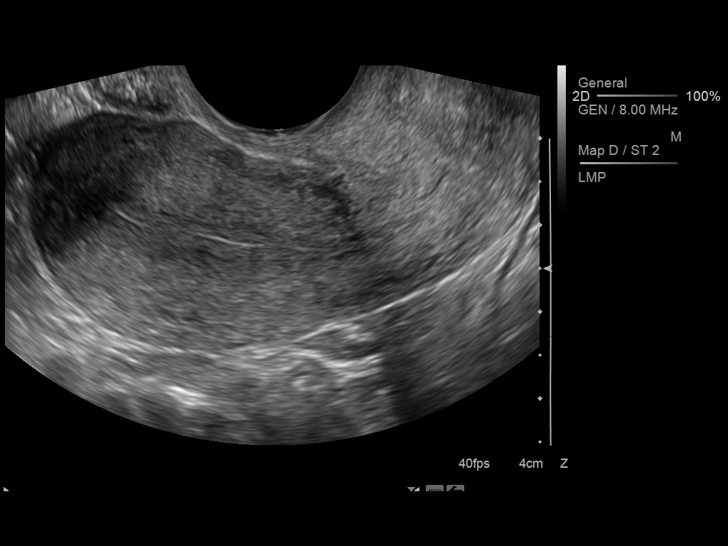
[im 33/44]
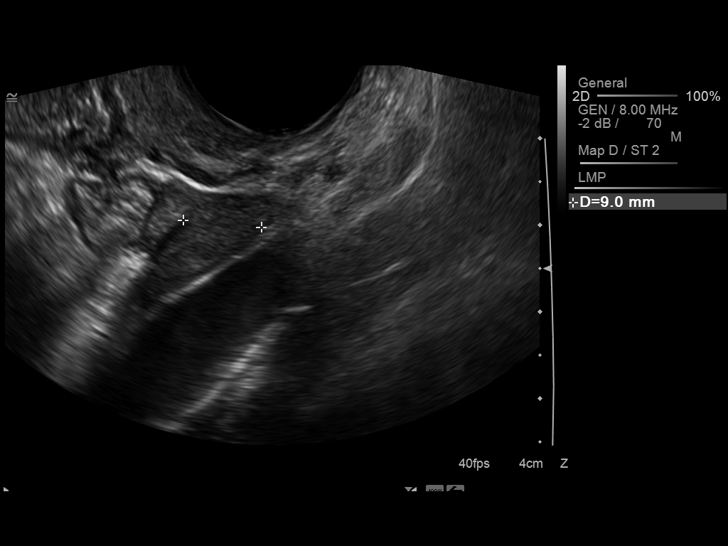
[im 36/44]
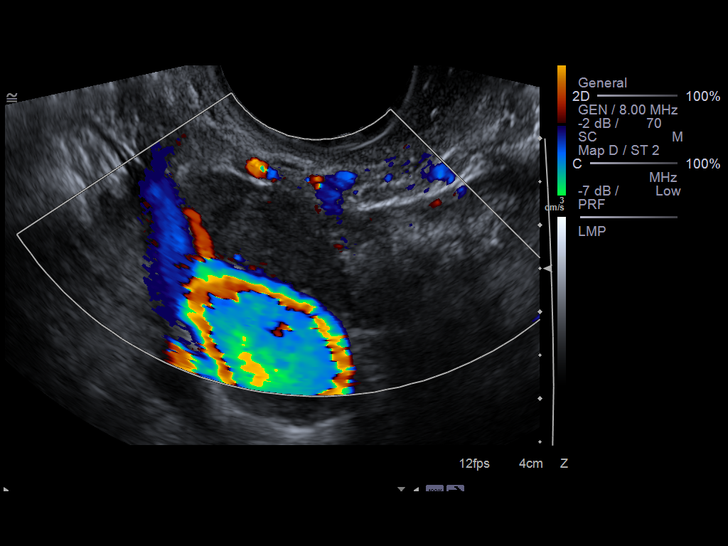
[im 40/44]
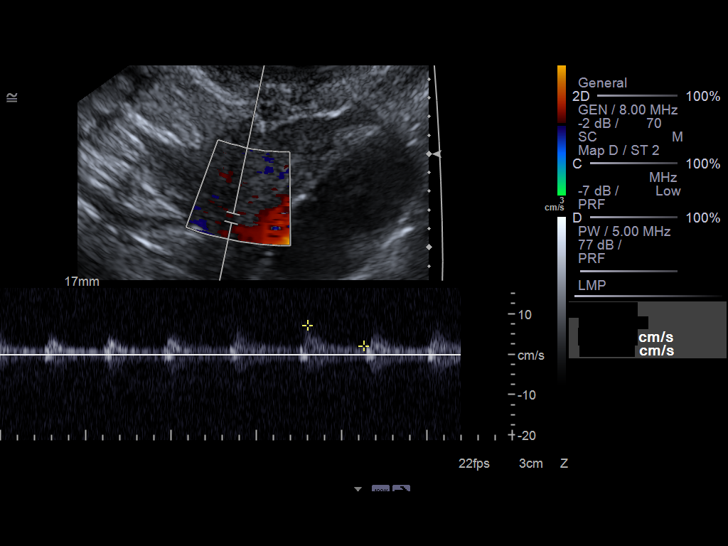
[im 44/44]
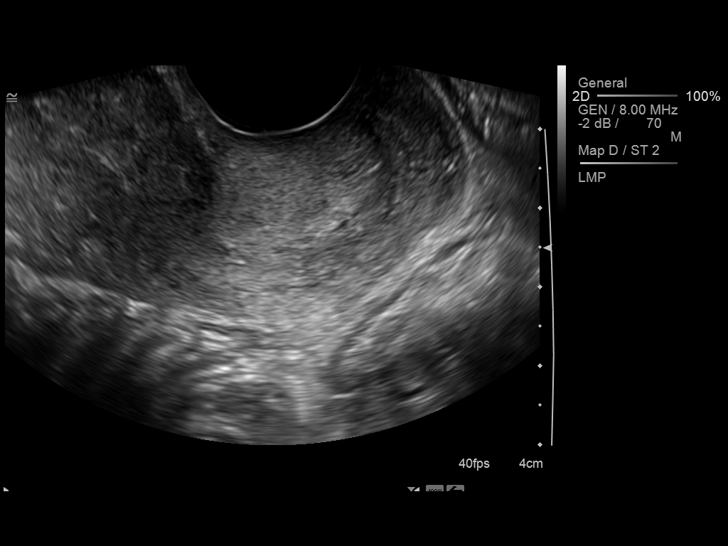

[13 of 25 positions shown; findings below may reference images not displayed]

FINDINGS: Uterus:  Normal echotexture.  The uterus measures 6.8 x 2.9 cm x
3.9 cm.

Endometrium:  Diminutive, 1 mm in thickness.

Right ovary: Within normal limits measuring 1.9 x 3.4 x 1.2 cm.

Left ovary:      Appears symmetric to the right ovary in
echotexture.  Measures 0.9 x 2.2 x 0.9 cm.

Pulsed Doppler evaluation demonstrates symmetric low-resistance
arterial and venous waveforms in both ovaries.

Other findings:  No pelvic free fluid.
IMPRESSION: Normal exam.  No evidence of pelvic mass or other significant
abnormality.  No sonographic evidence for ovarian torsion.

## 2015-05-12 IMAGING — US US PELVIS COMPLETE
1 series · 14 of 25 positions shown · non-contrast
Comparison: 05/08/2012

, *RADIOLOGY REPORT*
CLINICAL DATA: Pelvic pain



[Series 1: us pelvis complete · 14 of 48 slices shown]
[im 1/48]
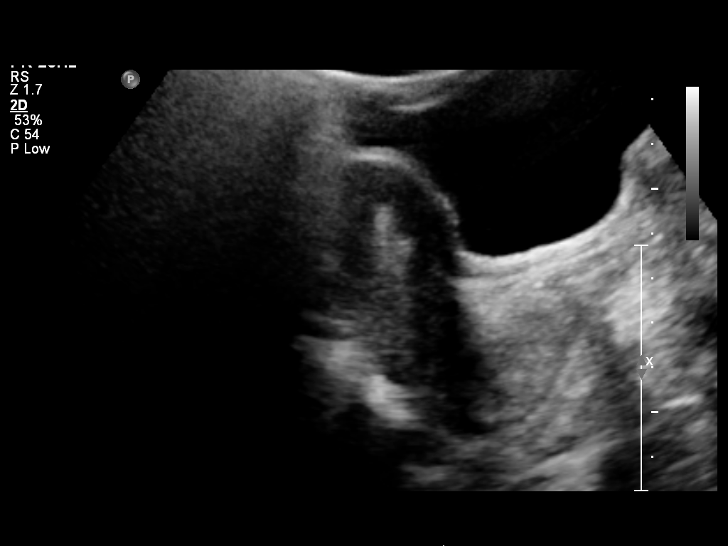
[im 4/48]
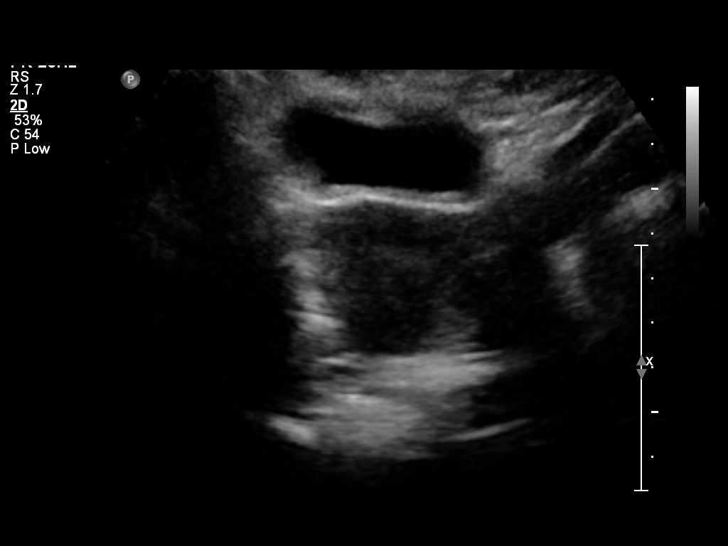
[im 8/48]
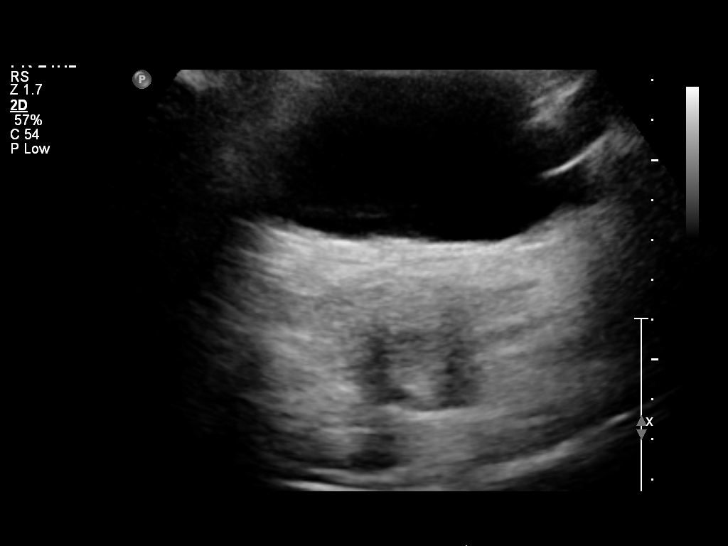
[im 12/48]
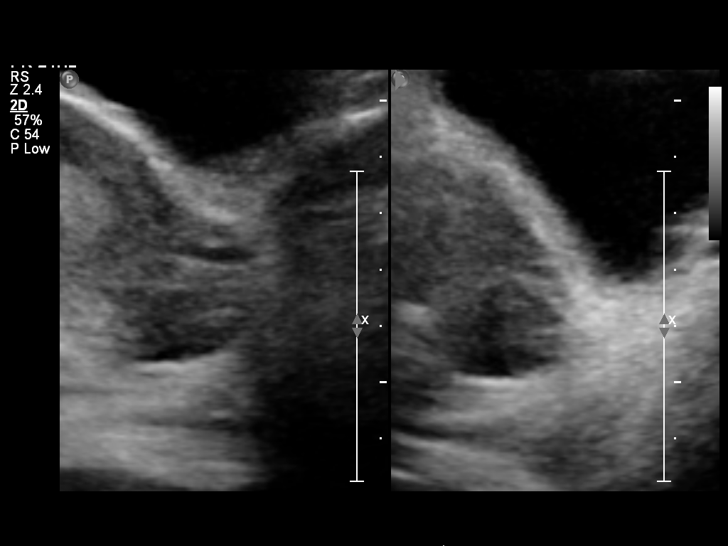
[im 16/48]
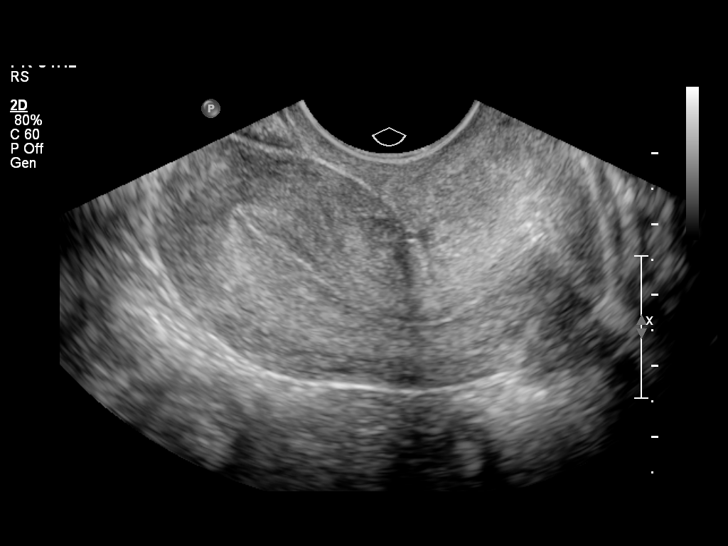
[im 18/48]
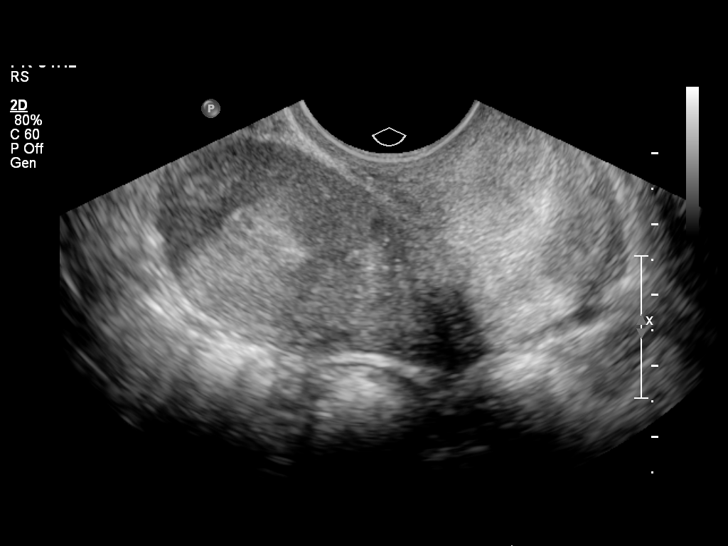
[im 22/48]
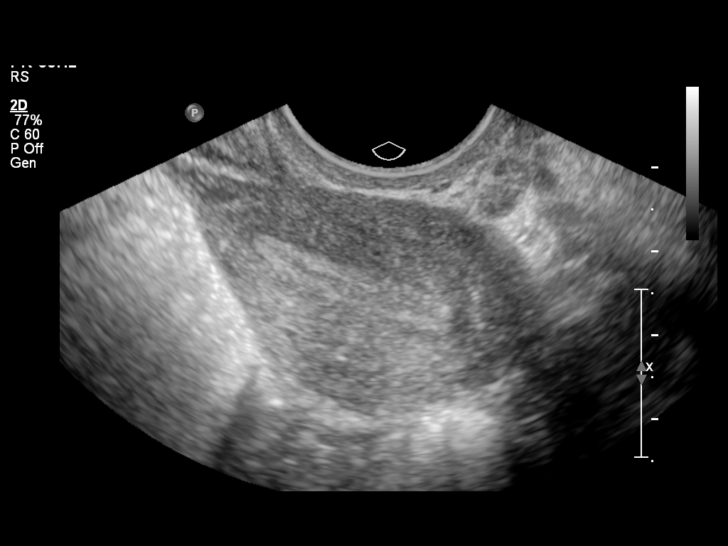
[im 26/48]
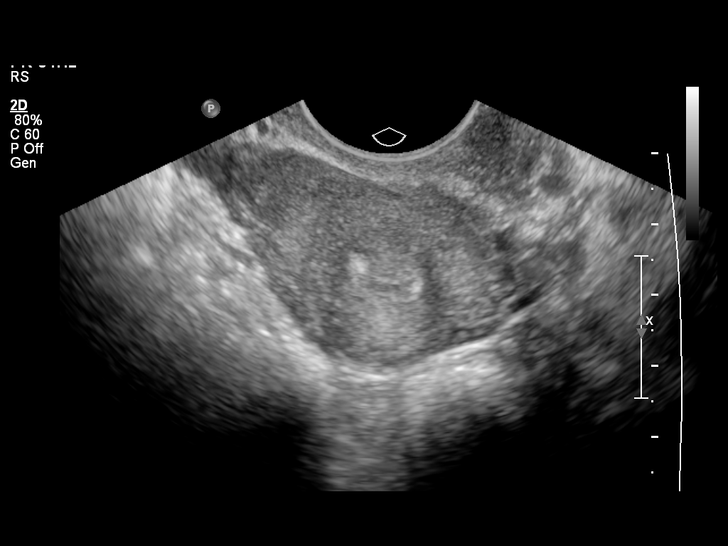
[im 30/48]
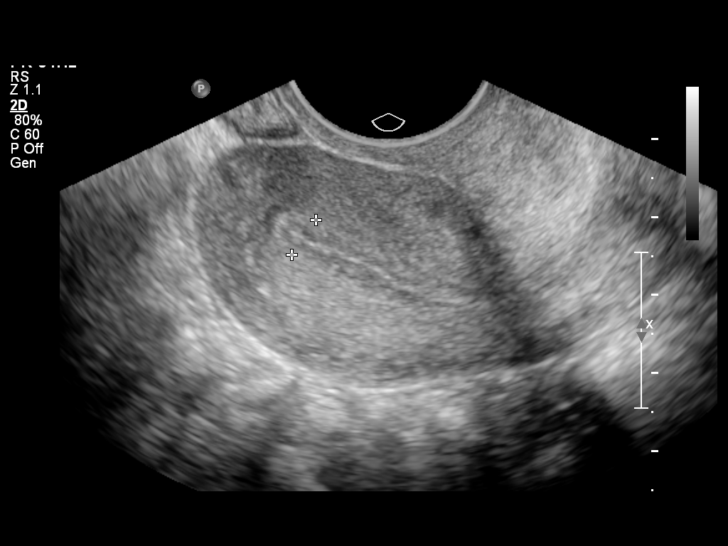
[im 32/48]
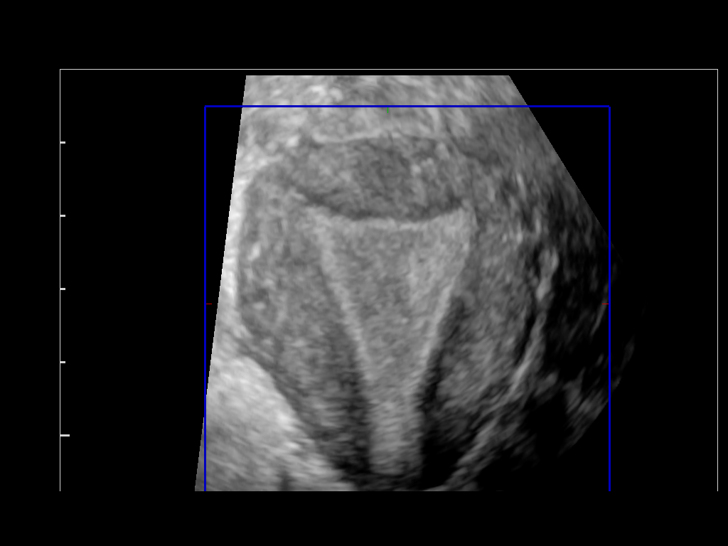
[im 36/48]
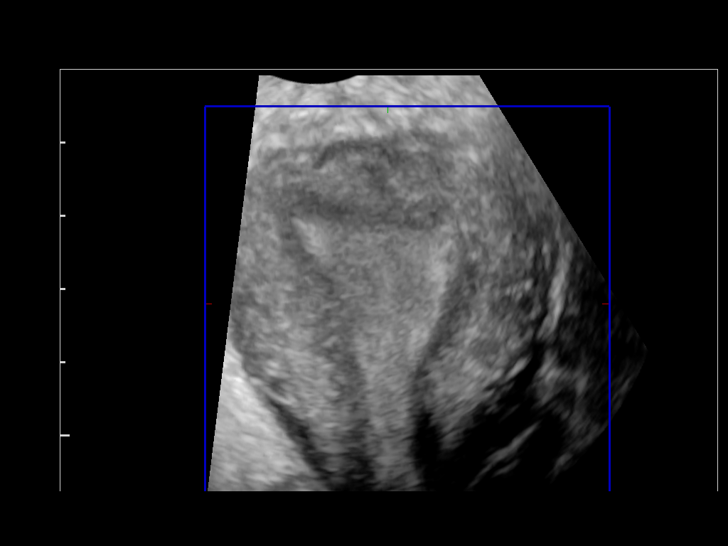
[im 40/48]
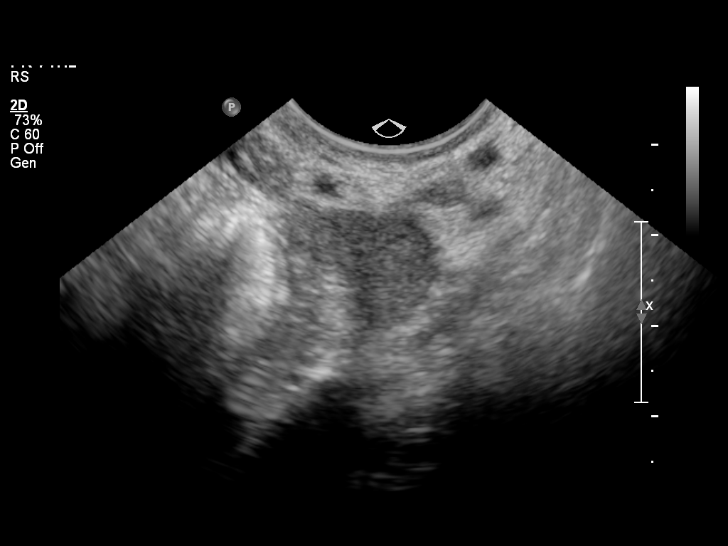
[im 44/48]
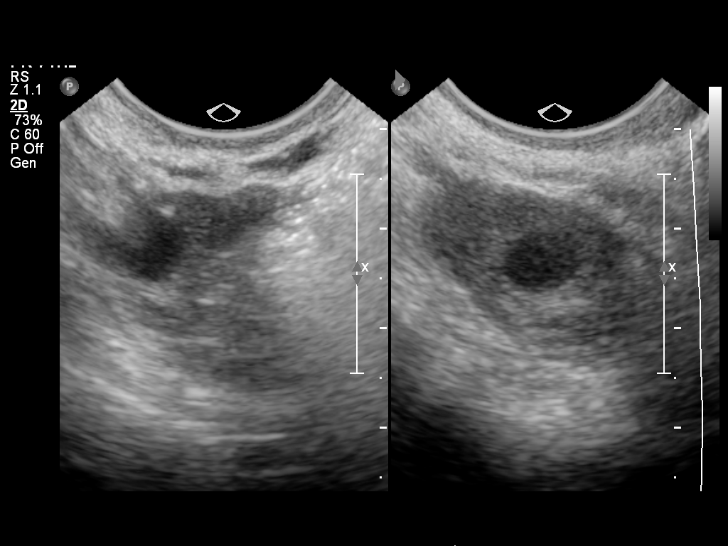
[im 48/48]
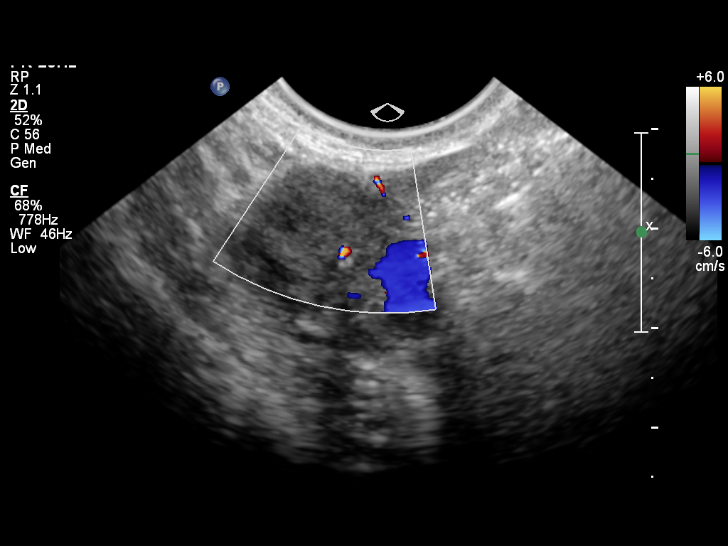

[14 of 25 positions shown; findings below may reference images not displayed]

FINDINGS: Uterus:  Normal in size and appearance.  Measures 6.3 x 3.0 x
cm.

Endometrium: Normal in thickness and appearance.  Measures 5.4 mm.

Right ovary: Normal appearance/no adnexal mass.  Measures 2.2 x
x 1.4 cm.

Left ovary: Normal appearance/no adnexal mass.  Measures 2.3 x
x 1.9 cm.

Other Findings:  No free fluid
IMPRESSION: Normal study.  No evidence of pelvic mass or other significant
abnormality.

## 2020-04-24 ENCOUNTER — Emergency Department: Admit: 2020-04-24 | Payer: PRIVATE HEALTH INSURANCE | Primary: Family Medicine

## 2020-04-24 ENCOUNTER — Inpatient Hospital Stay: Admit: 2020-04-24 | Discharge: 2020-04-24 | Payer: BLUE CROSS/BLUE SHIELD

## 2020-04-24 ENCOUNTER — Encounter: Admit: 2020-04-24 | Payer: PRIVATE HEALTH INSURANCE | Primary: Family Medicine

## 2020-04-24 DIAGNOSIS — R1032 Left lower quadrant pain: Secondary | ICD-10-CM

## 2020-04-24 DIAGNOSIS — R300 Dysuria: Secondary | ICD-10-CM

## 2020-04-24 LAB — COMPREHENSIVE METABOLIC PANEL
BKR A/G RATIO: 1.2 (ref 1.0–2.2)
BKR ALANINE AMINOTRANSFERASE (ALT): 38 U/L — ABNORMAL HIGH (ref 10–35)
BKR ALBUMIN: 4 g/dL (ref 3.6–4.9)
BKR ALKALINE PHOSPHATASE: 109 U/L (ref 9–122)
BKR ANION GAP: 11 (ref 7–17)
BKR ASPARTATE AMINOTRANSFERASE (AST): 31 U/L (ref 10–35)
BKR AST/ALT RATIO: 0.8
BKR BILIRUBIN TOTAL: 0.4 mg/dL (ref <=1.2)
BKR BLOOD UREA NITROGEN: 10 mg/dL (ref 6–20)
BKR BUN / CREAT RATIO: 11.5 (ref 8.0–23.0)
BKR CALCIUM: 9.3 mg/dL (ref 8.8–10.2)
BKR CHLORIDE: 103 mmol/L (ref 98–107)
BKR CO2: 24 mmol/L (ref 20–30)
BKR CREATININE: 0.87 mg/dL (ref 0.40–1.30)
BKR EGFR (AFR AMER): >60 mL/min/{1.73_m2} (ref >60)
BKR EGFR (NON AFRICAN AMERICAN): >60 mL/min/{1.73_m2} (ref >60)
BKR GLOBULIN: 3.4 g/dL (ref <=2.0)
BKR GLUCOSE: 95 mg/dL (ref 70–100)
BKR PROTEIN TOTAL: 7.4 g/dL (ref 6.6–8.7)
BKR SODIUM: 138 mmol/L (ref 136–144)

## 2020-04-24 LAB — CBC WITH AUTO DIFFERENTIAL
BKR WAM ABSOLUTE IMMATURE GRANULOCYTES: 0 x 1000/??L (ref 0.0–0.4)
BKR WAM ABSOLUTE LYMPHOCYTE COUNT: 1.5 x 1000/ÂµL (ref 0.5–5.4)
BKR WAM ABSOLUTE NRBC: 0 x 1000/??L (ref >60)
BKR WAM ANALYZER ANC: 6.9 x 1000/ÂµL (ref 2.2–7.2)
BKR WAM BASOPHIL ABSOLUTE COUNT: 0 x 1000/ÂµL (ref 0.0–0.2)
BKR WAM BASOPHILS: 0.1 % (ref 0.0–2.0)
BKR WAM EOSINOPHIL ABSOLUTE COUNT: 0 x 1000/ÂµL (ref 0.0–0.4)
BKR WAM EOSINOPHILS: 0.3 % (ref 0.0–4.0)
BKR WAM HEMATOCRIT: 35.6 % — ABNORMAL LOW (ref 36.0–48.0)
BKR WAM HEMOGLOBIN: 11.7 g/dL — ABNORMAL LOW (ref 12.0–15.0)
BKR WAM IMMATURE GRANULOCYTES: 0.3 % (ref 0.0–0.4)
BKR WAM LYMPHOCYTES: 17.2 % (ref 10.0–50.0)
BKR WAM MCH (PG): 28.9 pg (ref 25.0–35.0)
BKR WAM MCV: 87.9 fL — ABNORMAL HIGH (ref 81.0–99.0)
BKR WAM MONOCYTE ABSOLUTE COUNT: 0.4 x 1000/ÂµL (ref 0.1–1.2)
BKR WAM MONOCYTES: 4.7 % (ref 3.0–11.0)
BKR WAM MPV: 9.8 fL (ref 8.0–12.0)
BKR WAM NEUTROPHILS: 77.4 % — AB (ref 45.0–90.0)
BKR WAM NUCLEATED RED BLOOD CELLS: 0 % (ref 0.0–0.0)
BKR WAM PLATELETS: 282 x1000/??L (ref 120–450)
BKR WAM RED BLOOD CELL COUNT: 4.1 M/??L (ref 3.5–5.5)
BKR WAM WHITE BLOOD CELL COUNT: 8.9 x1000/ÂµL (ref 4.8–10.8)

## 2020-04-24 LAB — URINE MICROSCOPIC     (BH GH LMW YH)
BKR HYALINE CASTS, UA INSTRUMENT (NUMERIC): 2 /LPF (ref 0–3)
BKR RBC/HPF INSTRUMENT: 1 /HPF (ref 0–2)

## 2020-04-24 LAB — URINALYSIS WITH CULTURE REFLEX      (BH LMW YH)
BKR BILIRUBIN, UA: NEGATIVE
BKR BLOOD, UA: NEGATIVE
BKR GLUCOSE, UA: NEGATIVE g/dL (ref 6.6–8.7)
BKR KETONES, UA: NEGATIVE g/dL (ref 3.6–4.9)
BKR LEUKOCYTE ESTERASE, UA: POSITIVE — AB
BKR NITRITE, UA: NEGATIVE % (ref 0.0–1.0)
BKR PH, UA: 6.5 (ref 5.5–7.5)
BKR PROTEIN, UA: NEGATIVE /LPF (ref 8.0–23.0)
BKR SPECIFIC GRAVITY, UA: 1.012 % (ref 1.005–1.030)
BKR UROBILINOGEN, UA: <2 EU/dL (ref <=2.0)

## 2020-04-24 LAB — UA REFLEX CULTURE

## 2020-04-24 MED ORDER — OXYCODONE-ACETAMINOPHEN 5 MG-325 MG TABLET
5-325 mg | ORAL_TABLET | Freq: Four times a day (QID) | ORAL | 1 refills | Status: AC | PRN
Start: 2020-04-24 — End: ?

## 2020-04-24 MED ORDER — KETOROLAC 30 MG/ML (1 ML) INJECTION SOLUTION
30 mg/mL (1 mL) | Freq: Once | INTRAVENOUS | Status: CP
Start: 2020-04-24 — End: ?
  Administered 2020-04-24: 21:00:00 30 mL via INTRAVENOUS

## 2020-04-24 NOTE — ED Triage Note
I evaluated this patient as the Physician in triage and completed a medical screening examination.  Patient will need further care.  Initial care orders will be placed as appropriate.Juliann Mule, MD2:53 PM

## 2020-04-24 NOTE — ED Notes
47:39 PM 29 year old female presents to ED with c/o LLQ pain x 3 hours. A/O x 4. Denies fever, nausea, vomiting, headache, chest pain, sob, back pain, urinary symptoms. Pain becomes more intense with movement. Skin warm and dry appropriate for ethnicity. abd soft non distended mild tenderness with palpation. IV #20g established labs collected and sent. Urine sample collected and sent. Medicated per e-MAR for pain. Rating pain 5/10 on pain scale. Resting on stretcher at this time. Awaiting provider eval.

## 2020-04-24 NOTE — ED Notes
6:06 PM pelvic  Done  By dr  Raphael Gibney   Specimens   Sent  ,[place] ORDERED  MOVING TO CC  TO AWAIT [place]

## 2020-04-25 DIAGNOSIS — N83201 Unspecified ovarian cyst, right side: Principal | ICD-10-CM

## 2020-04-25 LAB — CHLAMYDIA TRACHOMATIS, NAAT     (BH GH LMW YH): BKR CHLAMYDIA, DNA PROBE: NEGATIVE

## 2020-04-25 LAB — ZZZTRICHOMONAS ANTIGEN     (BH): BKR TRICHOMONAS ANTIGEN: NEGATIVE

## 2020-04-25 LAB — NEISSERIA GONORRHEA, NAAT     (BH GH L LMW YH): BKR NEISSERIA GONORRHOEAE, DNA PROBE: NEGATIVE pg (ref 25.0–35.0)

## 2020-04-25 NOTE — ED Provider Notes
HistoryChief Complaint Patient presents with ? Abdominal Pain   lower abd pain x few hrs denies pregnancy no n/v no uti symptoms  no diarrha no fevers   29 year old female no significant past medical history presenting to the ED with suprapubic and left lower quadrant abdominal discomfort since approximately 1:00 p.m. today.  No associated nausea vomiting fevers chills cough chest pain shortness of breath or change in bowel habits.  Denies any vaginal discharge.  Last menstrual period was approximately August 20th.  Does have mild dysuria while in the ED bathroom earlier today.  Sexually active and denies any dyspareunia.  She does not use any form of birth control at this time.  No leaving or exacerbating factors.  Otherwise in her usual state of health. No past medical history on file.No past surgical history on file.No family history on file.Social History Socioeconomic History ? Marital status: Single   Spouse name: Not on file ? Number of children: Not on file ? Years of education: Not on file ? Highest education level: Not on file Tobacco Use ? Smoking status: Never Smoker Substance and Sexual Activity ? Alcohol use: Yes   Comment: Socially ED Other Social History E-cigarette/Vaping Substances E-cigarette/Vaping Devices Review of Systems All other systems reviewed and are negative. Physical ExamED Triage Vitals [04/24/20 1428]BP: 116/78Pulse: 67Pulse from  O2 sat: n/aResp: 20Temp: 98.9 ?F (37.2 ?C)Temp src: OralSpO2: 100 % BP 105/69  - Pulse 61  - Temp 98.1 ?F (36.7 ?C) (Oral)  - Resp 18  - Wt 87 kg (191 lb 12.8 oz)  - LMP 03/22/2020  - SpO2 100% Physical ExamVitals and nursing note reviewed. Constitutional:     Appearance: She is well-developed and normal weight. HENT:    Head: Normocephalic and atraumatic.    Mouth/Throat:    Mouth: Mucous membranes are moist.    Pharynx: Oropharynx is clear. Eyes:    Extraocular Movements: Extraocular movements intact.    Pupils: Pupils are equal, round, and reactive to light. Cardiovascular:    Rate and Rhythm: Normal rate and regular rhythm. Abdominal:    Palpations: Abdomen is soft.    Comments: Obese tender in the suprapubic and left lower quadrant area. Genitourinary:   Comments: Nurse Olegario Messier present during examination no external lesions.  Has mild CMT and no adnexal masses or tenderness.  No abnormal discharge.Neurological:    General: No focal deficit present.    Mental Status: She is alert. Psychiatric:       Mood and Affect: Mood normal. Mood is not anxious.  ProceduresProcedures ED COURSEPatient Reevaluation: [place] Non-OB Transvaginal Final Result      4.7 cm heterogeneously hypoechoic right ovarian lesion, most compatible with hemorrhagic cyst; consider six-week follow-up ultrasound examination to ensure resolution. 2.6 cm left ovarian cyst. Small amount of complex pelvic free fluid. 1.9 cm uterine fibroid.    Reported by:  Liborio Nixon, MD    Reported and signed by:  Rosey Bath, MD US Pelvis Complete Final Result      4.7 cm heterogeneously hypoechoic right ovarian lesion, most compatible with hemorrhagic cyst; consider six-week follow-up ultrasound examination to ensure resolution. 2.6 cm left ovarian cyst. Small amount of complex pelvic free fluid. 1.9 cm uterine fibroid.    Reported by:  Liborio Nixon, MD    Reported and signed by:  Rosey Bath, MD 29 year old female presenting to the ED with sudden onset lower abdominal pain discomfort.  Workup is notable for hemorrhagic cyst.  Appears well on re-evaluation a p.m..  Has had  no further nausea vomiting and the pain is well controlled.  Given a very short course of Percocet and counseled on narcotic pain medication usage/abdominal pain/ovarian cysts.  She states understanding and is comfortable with the plan for discharge home.Clinical Impressions as of Apr 24 2008 Hemorrhagic cyst of ovary  ED DispositionDischarge Jeral Fruit, MD09/12/21 2009

## 2020-04-25 NOTE — ED Notes
8:09 PM Discharge paperwork provided by APP. Medlock removed intact.West Pugh, RN

## 2020-04-26 LAB — GENITAL CULTURE     (BH GH LMW Q YH): BKR GENITAL CULTURE: NORMAL

## 2020-04-26 LAB — URINE CULTURE
